# Patient Record
Sex: Male | Born: 1975 | Race: Black or African American | Hispanic: No | Marital: Single | Smoking: Never smoker
Health system: Southern US, Community
[De-identification: ages and names within clinical notes are randomized; demographics above are authoritative.]

## PROBLEM LIST (undated history)

## (undated) DIAGNOSIS — R569 Unspecified convulsions: Secondary | ICD-10-CM

## (undated) DIAGNOSIS — D496 Neoplasm of unspecified behavior of brain: Secondary | ICD-10-CM

## (undated) DIAGNOSIS — Z87442 Personal history of urinary calculi: Secondary | ICD-10-CM

## (undated) DIAGNOSIS — J45909 Unspecified asthma, uncomplicated: Secondary | ICD-10-CM

## (undated) HISTORY — PX: WISDOM TOOTH EXTRACTION: SHX21

## (undated) HISTORY — PX: ABDOMINAL SURGERY: SHX537

---

## 1997-11-12 ENCOUNTER — Emergency Department (HOSPITAL_COMMUNITY): Admission: EM | Admit: 1997-11-12 | Discharge: 1997-11-12 | Payer: Self-pay | Admitting: Emergency Medicine

## 1998-09-12 ENCOUNTER — Emergency Department (HOSPITAL_COMMUNITY): Admission: EM | Admit: 1998-09-12 | Discharge: 1998-09-12 | Payer: Self-pay | Admitting: Emergency Medicine

## 2005-10-30 ENCOUNTER — Emergency Department (HOSPITAL_COMMUNITY): Admission: EM | Admit: 2005-10-30 | Discharge: 2005-10-30 | Payer: Self-pay | Admitting: Emergency Medicine

## 2015-12-10 ENCOUNTER — Inpatient Hospital Stay (HOSPITAL_COMMUNITY): Payer: PRIVATE HEALTH INSURANCE

## 2015-12-10 ENCOUNTER — Encounter (HOSPITAL_COMMUNITY): Payer: Self-pay | Admitting: Emergency Medicine

## 2015-12-10 ENCOUNTER — Inpatient Hospital Stay (HOSPITAL_COMMUNITY)
Admission: EM | Admit: 2015-12-10 | Discharge: 2015-12-12 | DRG: 100 | Disposition: A | Discharge status: Home / Self Care | Payer: PRIVATE HEALTH INSURANCE | Attending: Internal Medicine | Admitting: Internal Medicine

## 2015-12-10 ENCOUNTER — Emergency Department (HOSPITAL_COMMUNITY): Payer: PRIVATE HEALTH INSURANCE

## 2015-12-10 DIAGNOSIS — F1010 Alcohol abuse, uncomplicated: Secondary | ICD-10-CM

## 2015-12-10 DIAGNOSIS — R569 Unspecified convulsions: Secondary | ICD-10-CM

## 2015-12-10 DIAGNOSIS — C7931 Secondary malignant neoplasm of brain: Secondary | ICD-10-CM | POA: Diagnosis present

## 2015-12-10 DIAGNOSIS — G936 Cerebral edema: Secondary | ICD-10-CM | POA: Diagnosis present

## 2015-12-10 DIAGNOSIS — G939 Disorder of brain, unspecified: Secondary | ICD-10-CM | POA: Diagnosis not present

## 2015-12-10 DIAGNOSIS — W19XXXA Unspecified fall, initial encounter: Secondary | ICD-10-CM | POA: Diagnosis not present

## 2015-12-10 DIAGNOSIS — Z8249 Family history of ischemic heart disease and other diseases of the circulatory system: Secondary | ICD-10-CM | POA: Diagnosis not present

## 2015-12-10 DIAGNOSIS — F17210 Nicotine dependence, cigarettes, uncomplicated: Secondary | ICD-10-CM | POA: Diagnosis present

## 2015-12-10 DIAGNOSIS — G40409 Other generalized epilepsy and epileptic syndromes, not intractable, without status epilepticus: Principal | ICD-10-CM | POA: Diagnosis present

## 2015-12-10 DIAGNOSIS — G9389 Other specified disorders of brain: Secondary | ICD-10-CM | POA: Diagnosis present

## 2015-12-10 HISTORY — DX: Unspecified convulsions: R56.9

## 2015-12-10 LAB — DIFFERENTIAL
BASOS ABS: 0.1 10*3/uL (ref 0.0–0.1)
BASOS PCT: 1 %
EOS ABS: 0.3 10*3/uL (ref 0.0–0.7)
Eosinophils Relative: 3 %
Lymphocytes Relative: 48 %
Lymphs Abs: 4.5 10*3/uL — ABNORMAL HIGH (ref 0.7–4.0)
Monocytes Absolute: 0.7 10*3/uL (ref 0.1–1.0)
Monocytes Relative: 7 %
NEUTROS ABS: 3.8 10*3/uL (ref 1.7–7.7)
NEUTROS PCT: 41 %

## 2015-12-10 LAB — CBC
HCT: 42.3 % (ref 39.0–52.0)
HEMOGLOBIN: 14 g/dL (ref 13.0–17.0)
MCH: 28.2 pg (ref 26.0–34.0)
MCHC: 33.1 g/dL (ref 30.0–36.0)
MCV: 85.1 fL (ref 78.0–100.0)
Platelets: 183 10*3/uL (ref 150–400)
RBC: 4.97 MIL/uL (ref 4.22–5.81)
RDW: 13.5 % (ref 11.5–15.5)
WBC: 9.2 10*3/uL (ref 4.0–10.5)

## 2015-12-10 LAB — PROTIME-INR
INR: >10
INR: 1.05
PROTHROMBIN TIME: 13.8 s (ref 11.4–15.2)
Prothrombin Time: >90 seconds — ABNORMAL HIGH (ref 11.4–15.2)

## 2015-12-10 LAB — RAPID URINE DRUG SCREEN, HOSP PERFORMED
Amphetamines: NOT DETECTED
BARBITURATES: NOT DETECTED
Benzodiazepines: NOT DETECTED
COCAINE: NOT DETECTED
Opiates: NOT DETECTED
TETRAHYDROCANNABINOL: NOT DETECTED

## 2015-12-10 LAB — I-STAT CHEM 8, ED
BUN: 16 mg/dL (ref 6–20)
CALCIUM ION: 1.18 mmol/L (ref 1.13–1.30)
Chloride: 103 mmol/L (ref 101–111)
Creatinine, Ser: 1 mg/dL (ref 0.61–1.24)
GLUCOSE: 111 mg/dL — AB (ref 65–99)
HCT: 42 % (ref 39.0–52.0)
Hemoglobin: 14.3 g/dL (ref 13.0–17.0)
Potassium: 3.4 mmol/L — ABNORMAL LOW (ref 3.5–5.1)
SODIUM: 143 mmol/L (ref 135–145)
TCO2: 28 mmol/L (ref 0–100)

## 2015-12-10 LAB — URINALYSIS, ROUTINE W REFLEX MICROSCOPIC
BILIRUBIN URINE: NEGATIVE
GLUCOSE, UA: NEGATIVE mg/dL
HGB URINE DIPSTICK: NEGATIVE
KETONES UR: NEGATIVE mg/dL
Leukocytes, UA: NEGATIVE
NITRITE: NEGATIVE
PH: 6 (ref 5.0–8.0)
Protein, ur: NEGATIVE mg/dL
SPECIFIC GRAVITY, URINE: 1.025 (ref 1.005–1.030)

## 2015-12-10 LAB — BASIC METABOLIC PANEL
ANION GAP: 10 (ref 5–15)
BUN: 17 mg/dL (ref 6–20)
CALCIUM: 9 mg/dL (ref 8.9–10.3)
CHLORIDE: 105 mmol/L (ref 101–111)
CO2: 23 mmol/L (ref 22–32)
CREATININE: 1.02 mg/dL (ref 0.61–1.24)
GFR calc Af Amer: >60 mL/min (ref >60)
GFR calc non Af Amer: >60 mL/min (ref >60)
GLUCOSE: 119 mg/dL — AB (ref 65–99)
Potassium: 4.6 mmol/L (ref 3.5–5.1)
Sodium: 138 mmol/L (ref 135–145)

## 2015-12-10 LAB — CBG MONITORING, ED
GLUCOSE-CAPILLARY: 113 mg/dL — AB (ref 65–99)
Glucose-Capillary: 119 mg/dL — ABNORMAL HIGH (ref 65–99)

## 2015-12-10 LAB — APTT: aPTT: >200 seconds (ref 24–36)

## 2015-12-10 MED ORDER — ADULT MULTIVITAMIN W/MINERALS CH
1.0000 | ORAL_TABLET | Freq: Every day | ORAL | Status: DC
  Administered 2015-12-10 – 2015-12-12 (×3): 1 via ORAL
  Filled 2015-12-10 (×3): qty 1

## 2015-12-10 MED ORDER — SODIUM CHLORIDE 0.9 % IV SOLN
500.0000 mg | Freq: Two times a day (BID) | INTRAVENOUS | Status: DC
  Administered 2015-12-10 – 2015-12-12 (×5): 500 mg via INTRAVENOUS
  Filled 2015-12-10 (×6): qty 5

## 2015-12-10 MED ORDER — LORAZEPAM 2 MG/ML IJ SOLN
1.0000 mg | Freq: Once | INTRAMUSCULAR | Status: AC
  Administered 2015-12-10: 1 mg via INTRAVENOUS
  Filled 2015-12-10: qty 1

## 2015-12-10 MED ORDER — LEVETIRACETAM 500 MG/5ML IV SOLN
1000.0000 mg | Freq: Once | INTRAVENOUS | Status: AC
  Administered 2015-12-10: 1000 mg via INTRAVENOUS
  Filled 2015-12-10: qty 10

## 2015-12-10 MED ORDER — THIAMINE HCL 100 MG/ML IJ SOLN
100.0000 mg | Freq: Every day | INTRAMUSCULAR | Status: DC
Start: 2015-12-10 — End: 2015-12-11
  Filled 2015-12-10: qty 2

## 2015-12-10 MED ORDER — ACETAMINOPHEN 325 MG PO TABS: 650.0000 mg | ORAL_TABLET | Freq: Four times a day (QID) | ORAL | Status: DC | PRN

## 2015-12-10 MED ORDER — SODIUM CHLORIDE 0.9 % IV SOLN
INTRAVENOUS | Status: DC
  Administered 2015-12-10: 06:00:00 via INTRAVENOUS

## 2015-12-10 MED ORDER — HYDROMORPHONE HCL 1 MG/ML IJ SOLN
0.5000 mg | Freq: Once | INTRAMUSCULAR | Status: AC
  Administered 2015-12-10: 0.5 mg via INTRAVENOUS
  Filled 2015-12-10: qty 1

## 2015-12-10 MED ORDER — DEXAMETHASONE SODIUM PHOSPHATE 4 MG/ML IJ SOLN
4.0000 mg | Freq: Four times a day (QID) | INTRAMUSCULAR | Status: DC
  Administered 2015-12-10 – 2015-12-12 (×8): 4 mg via INTRAVENOUS
  Filled 2015-12-10 (×8): qty 1

## 2015-12-10 MED ORDER — ONDANSETRON HCL 4 MG PO TABS: 4.0000 mg | ORAL_TABLET | Freq: Four times a day (QID) | ORAL | Status: DC | PRN

## 2015-12-10 MED ORDER — LORAZEPAM 2 MG/ML IJ SOLN: 1.0000 mg | Freq: Four times a day (QID) | INTRAMUSCULAR | Status: DC | PRN

## 2015-12-10 MED ORDER — ACETAMINOPHEN 650 MG RE SUPP: 650.0000 mg | Freq: Four times a day (QID) | RECTAL | Status: DC | PRN

## 2015-12-10 MED ORDER — FOLIC ACID 1 MG PO TABS
1.0000 mg | ORAL_TABLET | Freq: Every day | ORAL | Status: DC
  Administered 2015-12-10 – 2015-12-12 (×3): 1 mg via ORAL
  Filled 2015-12-10 (×3): qty 1

## 2015-12-10 MED ORDER — LORAZEPAM 2 MG/ML IJ SOLN
0.0000 mg | Freq: Two times a day (BID) | INTRAMUSCULAR | Status: DC
Start: 2015-12-12 — End: 2015-12-12

## 2015-12-10 MED ORDER — SODIUM CHLORIDE 0.9% FLUSH
3.0000 mL | Freq: Two times a day (BID) | INTRAVENOUS | Status: DC
  Administered 2015-12-10 – 2015-12-11 (×3): 3 mL via INTRAVENOUS

## 2015-12-10 MED ORDER — VITAMIN B-1 100 MG PO TABS
100.0000 mg | ORAL_TABLET | Freq: Every day | ORAL | Status: DC
  Administered 2015-12-10 – 2015-12-12 (×3): 100 mg via ORAL
  Filled 2015-12-10 (×3): qty 1

## 2015-12-10 MED ORDER — LORAZEPAM 1 MG PO TABS
1.0000 mg | ORAL_TABLET | Freq: Four times a day (QID) | ORAL | Status: DC | PRN
Start: 2015-12-10 — End: 2015-12-12

## 2015-12-10 MED ORDER — DEXAMETHASONE SODIUM PHOSPHATE 10 MG/ML IJ SOLN
10.0000 mg | Freq: Once | INTRAMUSCULAR | Status: AC
  Administered 2015-12-10: 10 mg via INTRAVENOUS
  Filled 2015-12-10: qty 1

## 2015-12-10 MED ORDER — LORAZEPAM 2 MG/ML IJ SOLN: 1.0000 mg | INTRAMUSCULAR | Status: DC | PRN

## 2015-12-10 MED ORDER — LORAZEPAM 2 MG/ML IJ SOLN
0.0000 mg | Freq: Four times a day (QID) | INTRAMUSCULAR | Status: AC
Start: 2015-12-10 — End: 2015-12-12

## 2015-12-10 MED ORDER — GADOBENATE DIMEGLUMINE 529 MG/ML IV SOLN
19.0000 mL | Freq: Once | INTRAVENOUS | Status: AC | PRN
  Administered 2015-12-10: 19 mL via INTRAVENOUS

## 2015-12-10 MED ORDER — ONDANSETRON HCL 4 MG/2ML IJ SOLN: 4.0000 mg | Freq: Four times a day (QID) | INTRAMUSCULAR | Status: DC | PRN

## 2015-12-10 MED ORDER — SODIUM CHLORIDE 0.9 % IV SOLN: 1000.0000 mg | Freq: Once | INTRAVENOUS | Status: DC

## 2015-12-10 NOTE — ED Notes (Signed)
HR 116, CBG 108 in route.

## 2015-12-10 NOTE — ED Notes (Signed)
Bed: [id number] Expected date:  Expected time:  Means of arrival:  Comments: 89 m Seizure

## 2015-12-10 NOTE — ED Notes (Signed)
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  He began to fall to the ground and the co-worker helped ease him to the ground and he began convulsing.  Pt reports history of seizures as a child.

## 2015-12-10 NOTE — ED Provider Notes (Signed)
[facility] DEPT Provider Note   CSN: [id number] Arrival date & time: 12/10/15  0204  First Provider Contact:  First MD Initiated Contact with Patient 12/10/15 0403        History   Chief Complaint Chief Complaint  Patient presents with  . Seizures    HPI Timothy Murillo is a 40 y.o. male.  This a 40 year old male.  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  His coworker helped him to the ground where he proceeded to have a witnessed seizure.  Unfortunately, his coworker did not accompany him to describe the seizure.  Family states that he was staring out into space and not tracking for a long period of time after his seizure.  He does have a small contusion to his left eyebrow and tooth pain as a result of hitting his face on the ground.  His parents accompany him, he reported that he had a history of febrile seizures as an infant but by the time he was 44 had outgrown them.  Patient denies any head trauma, recent headaches or visual disturbances.  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 He reports no recent illnesses      Past Medical History:  Diagnosis Date  . Hernia of abdominal wall   . Seizures [facility])     Patient Active Problem List   Diagnosis Date Noted  . Mass, brain 12/10/2015    Past Surgical History:  Procedure Laterality Date  . ABDOMINAL SURGERY     hernia  . WISDOM TOOTH EXTRACTION         Home Medications    Prior to Admission medications   Not on File    Family History No family history on file.  Social History Social History  Substance Use Topics  . Smoking status: Current Some Day Smoker    Types: Cigarettes  . Smokeless tobacco: Never Used  . Alcohol use Yes     Comment: 2-3 times a week      Allergies   Review of patient's allergies indicates no known allergies.   Review of Systems Review of Systems    Constitutional: Negative for chills and fever.  HENT: Positive for dental problem. Negative for congestion.   Eyes: Negative for visual disturbance.  Gastrointestinal: Negative for abdominal pain.  Musculoskeletal: Negative for myalgias.  Neurological: Positive for seizures. Negative for dizziness, tremors, speech difficulty, weakness and headaches.  All other systems reviewed and are negative.    Physical Exam Updated Vital Signs BP 140/93   Pulse 74   Temp 98.1 F (36.7 C) (Oral)   Resp 22   Ht 6\' 2"  (1.88 m)   Wt 86.2 kg   SpO2 99%   BMI 24.39 kg/m   Physical Exam  Constitutional: He appears well-developed and well-nourished. No distress.  HENT:  Head: Normocephalic.    Right Ear: External ear normal.  Left Ear: External ear normal.  Mouth/Throat:    Eyes: Pupils are equal, round, and reactive to light.  Neck: Normal range of motion.  Cardiovascular: Normal rate.   Pulmonary/Chest: Effort normal.  Abdominal: Soft.  Musculoskeletal: Normal range of motion.  Neurological: He is alert.  Skin: Skin is warm.  Psychiatric: He has a normal mood and affect.  Nursing note and vitals reviewed.    ED Treatments / Results  Labs (all labs ordered are listed,  but only abnormal results are displayed) Labs Reviewed  BASIC METABOLIC PANEL - Abnormal; Notable for the following:       Result Value   Glucose, Bld 119 (*)    All other components within normal limits  DIFFERENTIAL - Abnormal; Notable for the following:    Lymphs Abs 4.5 (*)    All other components within normal limits  CBG MONITORING, ED - Abnormal; Notable for the following:    Glucose-Capillary 119 (*)    All other components within normal limits  CBC  URINALYSIS, ROUTINE W REFLEX MICROSCOPIC (NOT AT [facility])  URINE RAPID DRUG SCREEN, HOSP PERFORMED  CBC WITH DIFFERENTIAL/PLATELET  I-STAT CHEM 8, ED  CBG MONITORING, ED    EKG  EKG Interpretation  Date/Time:  Tuesday December 10 2015 04:14:00  EDT Ventricular Rate:  76 PR Interval:    QRS Duration: 92 QT Interval:  374 QTC Calculation: 421 R Axis:   46 Text Interpretation:  duplicate discard Confirmed by Roxanne Mins  MD, DAVID ([id number]) on 12/10/2015 4:28:38 AM       Radiology Ct Head Wo Contrast  Result Date: 12/10/2015 CLINICAL DATA:  Acute onset of convulsions. Hit right side of head. Initial encounter. EXAM: CT HEAD WITHOUT CONTRAST TECHNIQUE: Contiguous axial images were obtained from the base of the skull through the vertex without intravenous contrast. COMPARISON:  None. FINDINGS: A prominent vague area of decreased attenuation is noted at the white matter of the left posterior parietal lobe, concerning for underlying mass. MRI of the brain with contrast is recommended for further evaluation. There is no evidence of acute infarct or intra or extra-axial hemorrhage at this time. The posterior fossa, including the cerebellum, brainstem and fourth ventricle, is within normal limits. The third and lateral ventricles, and basal ganglia are unremarkable in appearance. No midline shift is seen. There is no evidence of fracture; visualized osseous structures are unremarkable in appearance. The visualized portions of the orbits are within normal limits. The paranasal sinuses and mastoid air cells are well-aerated. No significant soft tissue abnormalities are seen. IMPRESSION: Prominent vague area of decreased attenuation at the white matter of the left posterior parietal lobe, concerning for underlying mass. MRI of the brain with contrast is recommended for further evaluation. These results were called by telephone at the time of interpretation on 12/10/2015 at [id number] am to Dr. Roxanne Mins, who verbally acknowledged these results. Electronically Signed   By: Garald Balding M.D.   On: 12/10/2015 04:42    Procedures Procedures (including critical care time)  Medications Ordered in ED Medications  LORazepam (ATIVAN) injection 1 mg (not administered)   levETIRAcetam (KEPPRA) 1,000 mg in sodium chloride 0.9 % 100 mL IVPB (not administered)  dexamethasone (DECADRON) injection 10 mg (not administered)  levETIRAcetam (KEPPRA) 1,000 mg in sodium chloride 0.9 % 100 mL IVPB (not administered)  LORazepam (ATIVAN) injection 1 mg (1 mg Intravenous Given 12/10/15 0500)  HYDROmorphone (DILAUDID) injection 0.5 mg (0.5 mg Intravenous Given 12/10/15 0521)     Initial Impression / Assessment and Plan / ED Course  I have reviewed the triage vital signs and the nursing notes.  Pertinent labs & imaging results that were available during my care of the patient were reviewed by me and considered in my medical decision making (see chart for details).  Clinical Course   CT scan is concerning for a brain mass.  MRI is recommended.  Patient has been given 1 mg of Ativan to prevent further seizures.  MRI has been ordered.  I will make arrangements for admission to the hospital for further evaluation Reason has been given a gram of Keppra.  10 mg of Decadron. I discussed the CT findings with patient and family   Final Clinical Impressions(s) / ED Diagnoses   Final diagnoses:  Seizure [facility])  Mass of brain    New Prescriptions New Prescriptions   No medications on file     Junius Creamer, NP 12/10/15 0504    Junius Creamer, NP 12/10/15 [place], [place] [id number]    David Glick, MD [id number]

## 2015-12-10 NOTE — Consult Note (Signed)
Reason for Consult: Brain mass Referring Physician: [facility]  Timothy Murillo is an 40 y.o. male.  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 He currently denies headache denies any nausea vomiting denies any difficulty with his vision has had some dysphagia over the last couple weeks of unknown etiology denies any singing arms or his legs.  Past Medical History:  Diagnosis Date  . Hernia of abdominal wall   . Seizures ([place])     Past Surgical History:  Procedure Laterality Date  . ABDOMINAL SURGERY     hernia  . WISDOM TOOTH EXTRACTION      Family History  Problem Relation Age of Onset  . Hypertension Mother   . Hypertension Father     Social History:  reports that he has been smoking Cigarettes.  He has never used smokeless tobacco. He reports that he drinks alcohol. He reports that he does not use drugs.  Allergies: No Known Allergies  Medications: I have reviewed the patient's current medications.  Results for orders placed or performed during the hospital encounter of 12/10/15 (from the past 48 hour(s))  Basic metabolic panel - if new onset seizures     Status: Abnormal   Collection Time: 12/10/15  2:20 AM  Result Value Ref Range   Sodium 138 135 - 145 mmol/L   Potassium 4.6 3.5 - 5.1 mmol/L    Comment: SLIGHT HEMOLYSIS   Chloride 105 101 - 111 mmol/L   CO2 23 22 - 32 mmol/L   Glucose, Bld 119 (H) 65 - 99 mg/dL   BUN 17 6 - 20 mg/dL   Creatinine, Ser 1.02 0.61 - 1.24 mg/dL   Calcium 9.0 8.9 - 10.3 mg/dL   GFR calc non Af Amer >60 >60 mL/min   GFR calc Af Amer >60 >60 mL/min    Comment: (NOTE) The eGFR has been calculated using the CKD EPI equation. This calculation has not  been validated in all clinical situations. eGFR's persistently <60 mL/min signify possible Chronic Kidney Disease.    Anion gap 10 5 - 15  CBC - if new onset seizures     Status: None   Collection Time: 12/10/15  2:20 AM  Result Value Ref Range   WBC 9.2 4.0 - 10.5 K/uL   RBC 4.97 4.22 - 5.81 MIL/uL   Hemoglobin 14.0 13.0 - 17.0 g/dL   HCT 42.3 39.0 - 52.0 %   MCV 85.1 78.0 - 100.0 fL   MCH 28.2 26.0 - 34.0 pg   MCHC 33.1 30.0 - 36.0 g/dL   RDW 13.5 11.5 - 15.5 %   Platelets 183 150 - 400 K/uL  Differential     Status: Abnormal   Collection Time: 12/10/15  2:20 AM  Result Value Ref Range   Neutrophils Relative % 41 %   Neutro Abs 3.8 1.7 - 7.7 K/uL   Lymphocytes Relative 48 %   Lymphs Abs 4.5 (H) 0.7 - 4.0 K/uL   Monocytes Relative 7 %   Monocytes Absolute 0.7 0.1 - 1.0 K/uL   Eosinophils Relative 3 %   Eosinophils Absolute 0.3 0.0 - 0.7 K/uL   Basophils Relative 1 %   Basophils Absolute  0.1 0.0 - 0.1 K/uL  CBG monitoring, ED     Status: Abnormal   Collection Time: 12/10/15  2:25 AM  Result Value Ref Range   Glucose-Capillary 119 (H) 65 - 99 mg/dL  I-stat chem 8, ed     Status: Abnormal   Collection Time: 12/10/15  4:08 AM  Result Value Ref Range   Sodium 143 135 - 145 mmol/L   Potassium 3.4 (L) 3.5 - 5.1 mmol/L   Chloride 103 101 - 111 mmol/L   BUN 16 6 - 20 mg/dL   Creatinine, Ser 1.00 0.61 - 1.24 mg/dL   Glucose, Bld 111 (H) 65 - 99 mg/dL   Calcium, Ion 1.18 1.13 - 1.30 mmol/L   TCO2 28 0 - 100 mmol/L   Hemoglobin 14.3 13.0 - 17.0 g/dL   HCT 42.0 39.0 - 52.0 %  Urinalysis, Routine w reflex microscopic (not at [facility])     Status: None   Collection Time: 12/10/15  4:16 AM  Result Value Ref Range   Color, Urine YELLOW YELLOW   APPearance CLEAR CLEAR   Specific Gravity, Urine 1.025 1.005 - 1.030   pH 6.0 5.0 - 8.0   Glucose, UA NEGATIVE NEGATIVE mg/dL   Hgb urine dipstick NEGATIVE NEGATIVE   Bilirubin Urine NEGATIVE NEGATIVE   Ketones, ur NEGATIVE NEGATIVE mg/dL    Protein, ur NEGATIVE NEGATIVE mg/dL   Nitrite NEGATIVE NEGATIVE   Leukocytes, UA NEGATIVE NEGATIVE    Comment: MICROSCOPIC NOT DONE ON URINES WITH NEGATIVE PROTEIN, BLOOD, LEUKOCYTES, NITRITE, OR GLUCOSE <1000 mg/dL.  Urine rapid drug screen (hosp performed)     Status: None   Collection Time: 12/10/15  4:16 AM  Result Value Ref Range   Opiates NONE DETECTED NONE DETECTED   Cocaine NONE DETECTED NONE DETECTED   Benzodiazepines NONE DETECTED NONE DETECTED   Amphetamines NONE DETECTED NONE DETECTED   Tetrahydrocannabinol NONE DETECTED NONE DETECTED   Barbiturates NONE DETECTED NONE DETECTED    Comment:        DRUG SCREEN FOR MEDICAL PURPOSES ONLY.  IF CONFIRMATION IS NEEDED FOR ANY PURPOSE, NOTIFY LAB WITHIN 5 DAYS.        LOWEST DETECTABLE LIMITS FOR URINE DRUG SCREEN Drug Class       Cutoff (ng/mL) Amphetamine      1000 Barbiturate      200 Benzodiazepine   462 Tricyclics       703 Opiates          300 Cocaine          300 THC              50   Protime-INR     Status: Abnormal   Collection Time: 12/10/15  5:43 AM  Result Value Ref Range   Prothrombin Time >90.0 (H) 11.4 - 15.2 seconds   INR >10.00 (HH)     Comment: SPECIMEN CHECKED FOR CLOTS REPEATED TO VERIFY CRITICAL RESULT CALLED TO, READ BACK BY AND VERIFIED WITH: REPEATED TO VERIFY CRITICAL RESULT CALLED TO, READ BACK BY AND VERIFIED WITH: J. NASH RN AT 5009 ON 08.08.17 BY SHUEA   APTT     Status: Abnormal   Collection Time: 12/10/15  5:43 AM  Result Value Ref Range   aPTT >200 (HH) 24 - 36 seconds    Comment:        IF BASELINE aPTT IS ELEVATED, SUGGEST PATIENT RISK ASSESSMENT BE USED TO DETERMINE APPROPRIATE ANTICOAGULANT THERAPY. SPECIMEN CHECKED FOR CLOTS REPEATED TO VERIFY CRITICAL RESULT CALLED  TO, READ BACK BY AND VERIFIED WITH: J. NASH RN AT [phone] ON 08.08.17 BY SHUEA   POC CBG, ED     Status: Abnormal   Collection Time: 12/10/15  7:47 AM  Result Value Ref Range   Glucose-Capillary 113 (H) 65  - 99 mg/dL    Ct Head Wo Contrast  Result Date: 12/10/2015 CLINICAL DATA:  Acute onset of convulsions. Hit right side of head. Initial encounter. EXAM: CT HEAD WITHOUT CONTRAST TECHNIQUE: Contiguous axial images were obtained from the base of the skull through the vertex without intravenous contrast. COMPARISON:  None. FINDINGS: A prominent vague area of decreased attenuation is noted at the white matter of the left posterior parietal lobe, concerning for underlying mass. MRI of the brain with contrast is recommended for further evaluation. There is no evidence of acute infarct or intra or extra-axial hemorrhage at this time. The posterior fossa, including the cerebellum, brainstem and fourth ventricle, is within normal limits. The third and lateral ventricles, and basal ganglia are unremarkable in appearance. No midline shift is seen. There is no evidence of fracture; visualized osseous structures are unremarkable in appearance. The visualized portions of the orbits are within normal limits. The paranasal sinuses and mastoid air cells are well-aerated. No significant soft tissue abnormalities are seen. IMPRESSION: Prominent vague area of decreased attenuation at the white matter of the left posterior parietal lobe, concerning for underlying mass. MRI of the brain with contrast is recommended for further evaluation. These results were called by telephone at the time of interpretation on 12/10/2015 at 3:72 am to Dr. Roxanne Mins, who verbally acknowledged these results. Electronically Signed   By: Garald Balding M.D.   On: 12/10/2015 04:42    Review of Systems  Constitutional: Negative.   Neurological: Positive for dizziness and seizures.   Blood pressure (!) 120/58, pulse 64, temperature 98.1 F (36.7 C), temperature source Oral, resp. rate 16, height '6\' 2"'$  (1.88 m), weight 86.2 kg (190 lb), SpO2 100 %. Physical Exam  Constitutional: He is oriented to person, place, and time. He appears well-developed and  well-nourished.  HENT:  Head: Normocephalic.  Eyes: Pupils are equal, round, and reactive to light.  Neck: Normal range of motion.  Respiratory: Effort normal and breath sounds normal.  GI: Soft. Bowel sounds are normal.  Musculoskeletal: Normal range of motion.  Neurological: He is alert and oriented to person, place, and time. He has normal strength. GCS eye subscore is 4. GCS verbal subscore is 5. GCS motor subscore is 6.  Patient is awake and alert pupils are equal cranial nerves are intact strength is 5 out of 5 in his upper and lower extremities    Assessment/Plan: 40 year old gentleman with hyperdensity left parietal occipital lobe. Consistent with either brain mass primary or metastatic possibly infection recommend MRI scan of his brain and then depending the results of his MRI continued full metastatic workup if it has an appearance consistent with.  Roosevelt Eimers P 12/10/2015, 11:48 AM

## 2015-12-10 NOTE — ED Notes (Signed)
Hospitalist at bedside 

## 2015-12-10 NOTE — ED Notes (Signed)
Pt continuously asking where his wallet is.  Per EMS, no wallet was on patient or with patient at time of arrival.  EMS double checked truck with no wallet found.  Family and patient aware.

## 2015-12-10 NOTE — ED Notes (Signed)
[facility] called for transport. 

## 2015-12-10 NOTE — Care Management Note (Signed)
Case Management Note  Patient Details  Name: Timothy Murillo MRN: [id number] Date of Birth: 1976/04/20  Subjective/Objective:  Pt in with seizure. Awaiting MRI results. Pt is from home.                 Action/Plan: Awaiting further testing. CM following for discharge needs.   Expected Discharge Date:                  Expected Discharge Plan:  Home/Self Care  In-House Referral:     Discharge planning Services     Post Acute Care Choice:    Choice offered to:     DME Arranged:    DME Agency:     HH Arranged:    HH Agency:     Status of Service:  In process, will continue to follow  If discussed at Long Length of Stay Meetings, dates discussed:    Additional Comments:  Pollie Friar, RN 12/10/2015, 4:14 PM

## 2015-12-10 NOTE — Progress Notes (Addendum)
PROGRESS NOTE    Timothy Murillo  [id number] DOB: 1976-03-17 DOA: 12/10/2015 PCP: No PCP Per Patient    Brief Narrative: Left posterior parietal lobe mass. 40 yo male with history of seizures his childhood, presented to the hospital with generalized tonic clonic seizure. Transferred to from [facility] for further neurosurgery evaluation.   Assessment & Plan:   Principal Problem:   Seizure [facility]) Active Problems:   Mass of brain   1. Left posterior parietal lobe mass. Will follow on MRI report, follow with neurosurgery recommendations. Clinically patient awake and alert, non focal, no nausea or vomiting. Continue high dose steroids with decadron IV.   2. Seizures. Continue neuro checks, keppra and as needed lorazepam, continue to follow with neurology recommendations.   3. Etho abuse. No clinical signs of withdrawal, will continue as needed benzodiazepines, folic acid, multivitamin and thiamine.    Noted elevated PT/ INR will repeat test.   .DVT ppx: SCD Code Status: Full code Family Communication:  I spoke with patient's family at the bedside and all questions were addressed. Disposition Plan:  Anticipate discharge back to previous home environment  Consults called:  Neurosurgeon, Dr. Saintclair Halsted was consulted.  Procedures:   Antimicrobials:    Subjective: Patient with no headache, no nausea or vomiting. No chest pain or dyspnea. No recurrent seiziures.   Objective: Vitals:   12/10/15 0600 12/10/15 0630 12/10/15 0700 12/10/15 0833  BP: 133/87 125/86 128/87 (!) 120/58  Pulse: 69 70 66 64  Resp: 16 16 15 16   Temp:    98.1 F (36.7 C)  TempSrc:    Oral  SpO2: 96% 95% 96% 100%  Weight:      Height:       No intake or output data in the 24 hours ending 12/10/15 1243 Filed Weights   12/10/15 0209  Weight: 86.2 kg (190 lb)    Examination:  General exam: not in pain or dyspnea E ENT. No conjunctival pallor or icterus. Oral mucosa moist. Respiratory system: Clear to  auscultation. Respiratory effort normal. No wheezing, rales or rhonchi. Cardiovascular system: S1 & S2 heard, RRR. No JVD, murmurs, rubs, gallops or clicks. No pedal edema. Gastrointestinal system: Abdomen is nondistended, soft and nontender. No organomegaly or masses felt. Normal bowel sounds heard. Central nervous system: Alert and oriented. No focal neurological deficits. Extremities: Symmetric 5 x 5 power. Skin: No rashes, lesions or ulcers    Data Reviewed: I have personally reviewed following labs and imaging studies  CBC:  Recent Labs Lab 12/10/15 0220 12/10/15 0408  WBC 9.2  --   NEUTROABS 3.8  --   HGB 14.0 14.3  HCT 42.3 42.0  MCV 85.1  --   PLT 183  --    Basic Metabolic Panel:  Recent Labs Lab 12/10/15 0220 12/10/15 0408  NA 138 143  K 4.6 3.4*  CL 105 103  CO2 23  --   GLUCOSE 119* 111*  BUN 17 16  CREATININE 1.02 1.00  CALCIUM 9.0  --    GFR: Estimated Creatinine Clearance: 113 mL/min (by C-G formula based on SCr of 1 mg/dL). Liver Function Tests: No results for input(s): AST, ALT, ALKPHOS, BILITOT, PROT, ALBUMIN in the last 168 hours. No results for input(s): LIPASE, AMYLASE in the last 168 hours. No results for input(s): AMMONIA in the last 168 hours. Coagulation Profile:  Recent Labs Lab 12/10/15 0543  INR >10.00*   Cardiac Enzymes: No results for input(s): CKTOTAL, CKMB, CKMBINDEX, TROPONINI in the last 168 hours.  BNP (last 3 results) No results for input(s): PROBNP in the last 8760 hours. HbA1C: No results for input(s): HGBA1C in the last 72 hours. CBG:  Recent Labs Lab 12/10/15 0225 12/10/15 0747  GLUCAP 119* 113*   Lipid Profile: No results for input(s): CHOL, HDL, LDLCALC, TRIG, CHOLHDL, LDLDIRECT in the last 72 hours. Thyroid Function Tests: No results for input(s): TSH, T4TOTAL, FREET4, T3FREE, THYROIDAB in the last 72 hours. Anemia Panel: No results for input(s): VITAMINB12, FOLATE, FERRITIN, TIBC, IRON, RETICCTPCT in the  last 72 hours. Sepsis Labs: No results for input(s): PROCALCITON, LATICACIDVEN in the last 168 hours.  No results found for this or any previous visit (from the past 240 hour(s)).       Radiology Studies: Ct Head Wo Contrast  Result Date: 12/10/2015 CLINICAL DATA:  Acute onset of convulsions. Hit right side of head. Initial encounter. EXAM: CT HEAD WITHOUT CONTRAST TECHNIQUE: Contiguous axial images were obtained from the base of the skull through the vertex without intravenous contrast. COMPARISON:  None. FINDINGS: A prominent vague area of decreased attenuation is noted at the white matter of the left posterior parietal lobe, concerning for underlying mass. MRI of the brain with contrast is recommended for further evaluation. There is no evidence of acute infarct or intra or extra-axial hemorrhage at this time. The posterior fossa, including the cerebellum, brainstem and fourth ventricle, is within normal limits. The third and lateral ventricles, and basal ganglia are unremarkable in appearance. No midline shift is seen. There is no evidence of fracture; visualized osseous structures are unremarkable in appearance. The visualized portions of the orbits are within normal limits. The paranasal sinuses and mastoid air cells are well-aerated. No significant soft tissue abnormalities are seen. IMPRESSION: Prominent vague area of decreased attenuation at the white matter of the left posterior parietal lobe, concerning for underlying mass. MRI of the brain with contrast is recommended for further evaluation. These results were called by telephone at the time of interpretation on 12/10/2015 at [id number] am to Dr. Roxanne Mins, who verbally acknowledged these results. Electronically Signed   By: Garald Balding M.D.   On: 12/10/2015 04:42        Scheduled Meds: . dexamethasone  4 mg Intravenous [id number]  . folic acid  1 mg Oral Daily  . levETIRAcetam  500 mg Intravenous Q12H  . LORazepam  0-4 mg Intravenous Q6H    Followed by  . [START ON 12/12/2015] LORazepam  0-4 mg Intravenous Q12H  . multivitamin with minerals  1 tablet Oral Daily  . sodium chloride flush  3 mL Intravenous Q12H  . thiamine  100 mg Oral Daily   Or  . thiamine  100 mg Intravenous Daily   Continuous Infusions: . sodium chloride 100 mL/hr at 12/10/15 [id number]     LOS: 0 days      Tawni Millers, MD [facility] Pager [phone]  If 7PM-7AM, please contact night-coverage [email or web address] Password [facility] 12/10/2015, 12:43 PM

## 2015-12-10 NOTE — ED Notes (Signed)
Gave report to nurse on 5MW

## 2015-12-10 NOTE — Progress Notes (Deleted)
DR ARMSTRONG NOTIFIED VIA AMION, RN NOTIFIED THAT PT WILL HAVE MRI UNDER GENERAL ANESTHESIA ON 12/11/15 @ 10am, PT TO BE NPO AT MIDNIGHT FOR PROCEDURE

## 2015-12-10 NOTE — H&P (Signed)
History and Physical    Timothy Murillo [id number] DOB: Jan 23, 1976 DOA: 12/10/2015  Referring MD/NP/PA:   PCP: No PCP Per Patient   Patient coming from:  The patient is coming from home.  At baseline, pt is independent for most of ADL.   Chief Complaint: seizure  HPI: Timothy Murillo is a 40 y.o. male with medical history significant of seizure in childhood, who presents with seizure.   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  He fell to the ground, then began convulsing. He had LOC. Not sure how long it lasted. Pt states that he injured his left side of head and has HA in the same location. Pt reports history of seizures as a child. He does not have vision change, hearing loss, unilateral weakness, numbness or tingling is in extremities. No incontinence of bowel movement or urination. Patient denies fever, chills, chest pain, shortness of breath, cough, nausea, vomiting, diarrhea, abdominal pain. No symptoms of UTI. Patient reports that he drinks 3-6 beer 3-6 each time, 3-4 times a week, but never had withdraw problem.   ED Course: pt was found to have WBC 9.2, negative UDS, negative urinalysis, temperature normal, electrolytes and renal function okay. CT-head showed prominent vague area of decreased attenuation at the white matter of the left posterior parietal lobe, concerning for underlying mass. Pt is admitted to the telemetry bed as inpatient for further eventration treatment. Neurosurgeon, Dr. Saintclair Halsted was consulted.  Review of Systems:   General: no fevers, chills, no changes in body weight, has fatigue and HA HEENT: no blurry vision, hearing changes or sore throat Pulm: no dyspnea, coughing, wheezing CV: no chest pain, no palpitations Abd: no nausea, vomiting, abdominal pain, diarrhea, constipation GU: no dysuria, burning on urination, increased urinary frequency, hematuria  Ext: no leg edema Neuro: no unilateral weakness,  numbness, or tingling, no vision change or hearing loss. Had seizure. Skin: no rash MSK: No muscle spasm, no deformity, no limitation of range of movement in spin Heme: No easy bruising.  Travel history: No recent long distant travel.  Allergy: No Known Allergies  Past Medical History:  Diagnosis Date  . Hernia of abdominal wall   . Seizures ([place])     Past Surgical History:  Procedure Laterality Date  . ABDOMINAL SURGERY     hernia  . WISDOM TOOTH EXTRACTION      Social History:  reports that he has been smoking Cigarettes.  He has never used smokeless tobacco. He reports that he drinks alcohol. He reports that he does not use drugs.  Family History:  Family History  Problem Relation Age of Onset  . Hypertension Mother   . Hypertension Father      Prior to Admission medications   Not on File    Physical Exam: Vitals:   12/10/15 0245 12/10/15 0300 12/10/15 0439 12/10/15 0500  BP:   129/77 140/93  Pulse: 87 89 76 74  Resp: 19 18 17 22   Temp:      TempSrc:      SpO2: 97% 94% 97% 99%  Weight:      Height:       General: Not in acute distress HEENT:       Eyes: PERRL, EOMI, no scleral icterus.       ENT: No discharge from the ears and nose, no pharynx injection, no tonsillar enlargement.        Neck: No JVD, no bruit,  no mass felt. Heme: No neck lymph node enlargement. Cardiac: S1/S2, RRR, No murmurs, No gallops or rubs. Pulm: No rales, wheezing, rhonchi or rubs. Abd: Soft, nondistended, nontender, no rebound pain, no organomegaly, BS present. GU: No hematuria Ext: No pitting leg edema bilaterally. 2+DP/PT pulse bilaterally. Musculoskeletal: No joint deformities, No joint redness or warmth, no limitation of ROM in spin. Skin: No rashes.  Neuro: Alert, oriented X3, cranial nerves II-XII grossly intact, moves all extremities normally. Muscle strength 5/5 in all extremities, sensation to light touch intact. Knee reflex 1+ bilaterally. Negative Babinski's sign.  Normal finger to nose test. Psych: Patient is not psychotic, no suicidal or hemocidal ideation.  Labs on Admission: I have personally reviewed following labs and imaging studies  CBC:  Recent Labs Lab 12/10/15 0220 12/10/15 0408  WBC 9.2  --   NEUTROABS 3.8  --   HGB 14.0 14.3  HCT 42.3 42.0  MCV 85.1  --   PLT 183  --    Basic Metabolic Panel:  Recent Labs Lab 12/10/15 0220 12/10/15 0408  NA 138 143  K 4.6 3.4*  CL 105 103  CO2 23  --   GLUCOSE 119* 111*  BUN 17 16  CREATININE 1.02 1.00  CALCIUM 9.0  --    GFR: Estimated Creatinine Clearance: 113 mL/min (by C-G formula based on SCr of 1 mg/dL). Liver Function Tests: No results for input(s): AST, ALT, ALKPHOS, BILITOT, PROT, ALBUMIN in the last 168 hours. No results for input(s): LIPASE, AMYLASE in the last 168 hours. No results for input(s): AMMONIA in the last 168 hours. Coagulation Profile:  Recent Labs Lab 12/10/15 0543  INR >10.00*   Cardiac Enzymes: No results for input(s): CKTOTAL, CKMB, CKMBINDEX, TROPONINI in the last 168 hours. BNP (last 3 results) No results for input(s): PROBNP in the last 8760 hours. HbA1C: No results for input(s): HGBA1C in the last 72 hours. CBG:  Recent Labs Lab 12/10/15 0225  GLUCAP 119*   Lipid Profile: No results for input(s): CHOL, HDL, LDLCALC, TRIG, CHOLHDL, LDLDIRECT in the last 72 hours. Thyroid Function Tests: No results for input(s): TSH, T4TOTAL, FREET4, T3FREE, THYROIDAB in the last 72 hours. Anemia Panel: No results for input(s): VITAMINB12, FOLATE, FERRITIN, TIBC, IRON, RETICCTPCT in the last 72 hours. Urine analysis:    Component Value Date/Time   COLORURINE YELLOW 12/10/2015 0416   APPEARANCEUR CLEAR 12/10/2015 0416   LABSPEC 1.025 12/10/2015 0416   PHURINE 6.0 12/10/2015 0416   GLUCOSEU NEGATIVE 12/10/2015 0416   HGBUR NEGATIVE 12/10/2015 0416   BILIRUBINUR NEGATIVE 12/10/2015 0416   KETONESUR NEGATIVE 12/10/2015 0416   PROTEINUR NEGATIVE  12/10/2015 0416   NITRITE NEGATIVE 12/10/2015 0416   LEUKOCYTESUR NEGATIVE 12/10/2015 0416   Sepsis Labs: @LABRCNTIP (procalcitonin:4,lacticidven:4) )No results found for this or any previous visit (from the past 240 hour(s)).   Radiological Exams on Admission: Ct Head Wo Contrast  Result Date: 12/10/2015 CLINICAL DATA:  Acute onset of convulsions. Hit right side of head. Initial encounter. EXAM: CT HEAD WITHOUT CONTRAST TECHNIQUE: Contiguous axial images were obtained from the base of the skull through the vertex without intravenous contrast. COMPARISON:  None. FINDINGS: A prominent vague area of decreased attenuation is noted at the white matter of the left posterior parietal lobe, concerning for underlying mass. MRI of the brain with contrast is recommended for further evaluation. There is no evidence of acute infarct or intra or extra-axial hemorrhage at this time. The posterior fossa, including the cerebellum, brainstem and fourth ventricle, is within  normal limits. The third and lateral ventricles, and basal ganglia are unremarkable in appearance. No midline shift is seen. There is no evidence of fracture; visualized osseous structures are unremarkable in appearance. The visualized portions of the orbits are within normal limits. The paranasal sinuses and mastoid air cells are well-aerated. No significant soft tissue abnormalities are seen. IMPRESSION: Prominent vague area of decreased attenuation at the white matter of the left posterior parietal lobe, concerning for underlying mass. MRI of the brain with contrast is recommended for further evaluation. These results were called by telephone at the time of interpretation on 12/10/2015 at [id number] am to Dr. Roxanne Mins, who verbally acknowledged these results. Electronically Signed   By: Garald Balding M.D.   On: 12/10/2015 04:42     EKG: Independently reviewed. Sinus rhythm, QTC 421, no ischemic change. 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 No new episode of seizure in ED. Neuro surgeon, Dr. Saintclair Halsted was consulted. Per Dr. Saintclair Halsted, when MRI-brain result is available for mass, will need to start working on metastasis by CT scan (not ordered yet)  -will admit to tele bed as inpt -f/u Neurosurgeon's recommendation -MRI brain w/ and w/o contrast -Seizure precaution -Keppra: 1000 mg IV 1, followed by 500 mg twice a day -Decadron: 10 mg 1 and 4 mg every 6 hours IV -Nothing by mouth -IV fluids: Normocephalic and cc per hour -prn ativan for seizure -check HIV ab  Alcohol use: Patient reports that he drinks 3-6 beer 3-6 each time, 3-4 times a week, but never had withdraw problem.  -start CIWA protocol   DVT ppx: SCD Code Status: Full code Family Communication:  Yes, patient's  fianc, and parents  at bed side Disposition Plan:  Anticipate discharge back to previous home environment Consults called:  Neurosurgeon, Dr. Saintclair Halsted was consulted,will see today Admission status: Inpatient/tele  Date of Service 12/10/2015    Ivor Costa [facility] Pager [phone]  If 7PM-7AM, please contact night-coverage [email or web address] Password [facility] 12/10/2015, 6:41 AM

## 2015-12-10 NOTE — Evaluation (Signed)
Clinical/Bedside Swallow Evaluation Patient Details  Name: Timothy Murillo MRN: [id number] Date of Birth: 1975/05/27  Today's Date: 12/10/2015 Time: SLP Start Time (ACUTE ONLY): [id number] SLP Stop Time (ACUTE ONLY): 1510 SLP Time Calculation (min) (ACUTE ONLY): 14 min  Past Medical History:  Past Medical History:  Diagnosis Date  . Hernia of abdominal wall   . Seizures ([place])    Past Surgical History:  Past Surgical History:  Procedure Laterality Date  . ABDOMINAL SURGERY     hernia  . 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  Patient transferred to [facility] and MRI is pending.     Assessment / Plan / Recommendation Clinical Impression  Patient demonstrates intact oral motor abilities with efficient mastication, oral clearance, and swift swallow along with no overt s/s of aspiration.  Recommend initiation of regular textures and thin liquids with small portions at a slow pace as well as intermittent supervision for use of safety precautions.  Given reports of intermittent difficulty with swallowing recommend brief SLP follow up to ensure tolerance.   Skilled follow up services are not an anticipated need at this time.      Aspiration Risk  Mild aspiration risk    Diet Recommendation Regular;Thin liquid   Liquid Administration via: Cup;Straw Medication Administration: Whole meds with liquid Supervision: Patient able to self feed;Intermittent supervision to cue for compensatory strategies Compensations: Slow rate;Small sips/bites Postural Changes: Seated upright at 90 degrees    Other  Recommendations Oral Care Recommendations: Oral care BID   Follow up Recommendations  None    Frequency and Duration min 2x/week  1 week       Prognosis Prognosis for Safe  Diet Advancement: Good      Swallow Study   General HPI:  Patient is a 40 year old gentleman 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  Patient transferred to [facility] and MRI is pending.   Type of Study: Bedside Swallow Evaluation Previous Swallow Assessment: none on record Diet Prior to this Study: NPO Temperature Spikes Noted: No Respiratory Status: Room air History of Recent Intubation: No Behavior/Cognition: Alert;Cooperative;Pleasant mood Oral Cavity Assessment: Within Functional Limits Oral Care Completed by SLP: Recent completion by staff Oral Cavity - Dentition: Adequate natural dentition Vision: Functional for self-feeding Self-Feeding Abilities: Able to feed self Patient Positioning: Upright in bed Baseline Vocal Quality: Normal Volitional Cough: Strong Volitional Swallow: Able to elicit    Oral/Motor/Sensory Function Overall Oral Motor/Sensory Function: Within functional limits   Ice Chips Ice chips: Within functional limits   Thin Liquid Thin Liquid: Within functional limits Presentation: Cup;Straw    Nectar Thick Nectar Thick Liquid: Not tested   Honey Thick Honey Thick Liquid: Not tested   Puree Puree: Within functional limits Presentation: Self Fed;Spoon   Solid   GO   Solid: Within functional limits Presentation: Self Fed       Gunnar Fusi, M.A., CCC-SLP [phone]   [place] 12/10/2015,3:58 PM

## 2015-12-10 NOTE — ED Notes (Signed)
Report given to carelink. RN Water quality scientist at [facility] notified that transfer will occur within the hour. MRI will need to be completed to [facility] hospital.

## 2015-12-11 ENCOUNTER — Encounter (HOSPITAL_COMMUNITY): Payer: Self-pay | Admitting: Radiology

## 2015-12-11 ENCOUNTER — Inpatient Hospital Stay (HOSPITAL_COMMUNITY): Payer: PRIVATE HEALTH INSURANCE

## 2015-12-11 DIAGNOSIS — R569 Unspecified convulsions: Secondary | ICD-10-CM

## 2015-12-11 DIAGNOSIS — F1010 Alcohol abuse, uncomplicated: Secondary | ICD-10-CM

## 2015-12-11 DIAGNOSIS — G939 Disorder of brain, unspecified: Secondary | ICD-10-CM

## 2015-12-11 LAB — CBC
HCT: 42.6 % (ref 39.0–52.0)
Hemoglobin: 13.9 g/dL (ref 13.0–17.0)
MCH: 28.4 pg (ref 26.0–34.0)
MCHC: 32.6 g/dL (ref 30.0–36.0)
MCV: 86.9 fL (ref 78.0–100.0)
PLATELETS: 378 10*3/uL (ref 150–400)
RBC: 4.9 MIL/uL (ref 4.22–5.81)
RDW: 13.7 % (ref 11.5–15.5)
WBC: 16.3 10*3/uL — ABNORMAL HIGH (ref 4.0–10.5)

## 2015-12-11 LAB — PROTIME-INR
INR: 1.04
PROTHROMBIN TIME: 13.6 s (ref 11.4–15.2)

## 2015-12-11 LAB — COMPREHENSIVE METABOLIC PANEL
ALK PHOS: 44 U/L (ref 38–126)
ALT: 28 U/L (ref 17–63)
ANION GAP: 8 (ref 5–15)
AST: 36 U/L (ref 15–41)
Albumin: 3.5 g/dL (ref 3.5–5.0)
BILIRUBIN TOTAL: 0.9 mg/dL (ref 0.3–1.2)
BUN: 9 mg/dL (ref 6–20)
CALCIUM: 9.2 mg/dL (ref 8.9–10.3)
CO2: 25 mmol/L (ref 22–32)
CREATININE: 0.75 mg/dL (ref 0.61–1.24)
Chloride: 107 mmol/L (ref 101–111)
GFR calc non Af Amer: >60 mL/min (ref >60)
GLUCOSE: 139 mg/dL — AB (ref 65–99)
Potassium: 3.9 mmol/L (ref 3.5–5.1)
SODIUM: 140 mmol/L (ref 135–145)
TOTAL PROTEIN: 7 g/dL (ref 6.5–8.1)

## 2015-12-11 LAB — GLUCOSE, CAPILLARY: Glucose-Capillary: 143 mg/dL — ABNORMAL HIGH (ref 65–99)

## 2015-12-11 LAB — HIV ANTIBODY (ROUTINE TESTING W REFLEX): HIV Screen 4th Generation wRfx: NONREACTIVE

## 2015-12-11 MED ORDER — DIATRIZOATE MEGLUMINE & SODIUM 66-10 % PO SOLN
ORAL | Status: AC
  Administered 2015-12-11: 12:00:00
  Filled 2015-12-11: qty 30

## 2015-12-11 MED ORDER — IOPAMIDOL (ISOVUE-300) INJECTION 61%
INTRAVENOUS | Status: AC
  Administered 2015-12-11: 100 mL
  Filled 2015-12-11: qty 100

## 2015-12-11 NOTE — Progress Notes (Signed)
Speech Language Pathology Treatment: Dysphagia  Patient Details Name: Timothy Murillo MRN: [id number] DOB: [birth year] Today's Date: 12/11/2015 Time: 0211-1735 SLP Time Calculation (min) (ACUTE ONLY): 9 min  Assessment / Plan / Recommendation Clinical Impression  Pt was seen for skilled ST targeting dysphagia goals.  Pt had consumed the majority of breakfast tray upon therapist's arrival.  Per RN and pt, pt was not awaiting MRI as indicated in chart and cleared for POs.  Pt consumed regular textures and thin liquids without difficulty.   No overt s/s of aspiration evident with solids or liquids and pt was able to clear solids from the oral cavity post swallow.  Pt independent for use of swallowing precautions.  No reports of difficulty tolerating currently prescribed diet.  As a result, recommend discharging pt from [place] as he is at baseline for swallowing function.    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  Patient transferred to [facility] and MRI is pending.        SLP Plan  Other (Goals met, discharge from ST services)     Recommendations  Diet recommendations: Regular;Thin liquid Liquids provided via: Cup Medication Administration: Whole meds with liquid Supervision: Patient able to self feed Compensations: Slow rate;Small sips/bites             Oral Care Recommendations: Oral care BID Follow up Recommendations: None Plan: Other (Comment)     GO                Lajuan Godbee, Selinda Orion 12/11/2015, 9:30 AM

## 2015-12-11 NOTE — Progress Notes (Signed)
PROGRESS NOTE    Timothy Murillo  [id number] DOB: 1975-12-19 DOA: 12/10/2015 PCP: No PCP Per Patient   Outpatient Specialists:     Brief Narrative:  Left posterior parietal lobe mass in a 41 yo male with history of seizures in his childhood. He presented to the hospital with generalized tonic clonic seizure. Transferred to [facility]  from [facility] for further neurosurgery evaluation.    Assessment & Plan:   Principal Problem:   Seizure [facility]) Active Problems:   Mass of brain   Left posterior parietal lobe mass.  -MRI report: Solitary 17 mm intensely enhancing mass located along the periphery of the inferior left parietal lobe near the posterior temporal lobe. Coronal T2 images suggest the lesion could be extra-axial, but it does not have the typical non-contrast MRI signal of a meningioma.  Top differential considerations therefore are meningioma, solitary metastasis, and less likely a primary tumor such as high-grade glioma. -neurosurgery recommendations.  Continue high dose steroids with decadron IV.  -CT Scan done of chest/abd/pelvis  Seizures. Continue neuro checks, keppra and as needed lorazepam, continue to follow with neurology recommendations.    ETOH abuse.  -CIWA  DVT prophylaxis:  SCD's  Code Status: Full Code   Family Communication: Friend at bedside  Disposition Plan:     Consultants:   neurosurgery  Procedures:        Subjective: No current issue- eating well  Objective: Vitals:   12/11/15 0548 12/11/15 0613 12/11/15 0926 12/11/15 1405  BP: 122/60  (!) 116/56 122/68  Pulse: 60 64 67 71  Resp: 20  18 18   Temp: 97.6 F (36.4 C)  97.7 F (36.5 C) 98.8 F (37.1 C)  TempSrc: Oral  Oral Oral  SpO2: 100%  100% 100%  Weight:      Height:        Intake/Output Summary (Last 24 hours) at 12/11/15 1427 Last data filed at 12/11/15 0938  Gross per 24 hour  Intake              105 ml  Output                0 ml  Net              105 ml    Filed Weights   12/10/15 0209  Weight: 86.2 kg (190 lb)    Examination:  General exam: Appears calm and comfortable  Respiratory system: Clear to auscultation. Respiratory effort normal. Cardiovascular system: S1 & S2 heard, RRR. No JVD, murmurs, rubs, gallops or clicks. No pedal edema. Gastrointestinal system: Abdomen is nondistended, soft and nontender. No organomegaly or masses felt. Normal bowel sounds heard. Central nervous system: Alert and oriented. No focal neurological deficits. Extremities: Symmetric 5 x 5 power. 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 Mood & affect appropriate.     Data Reviewed: I have personally reviewed following labs and imaging studies  CBC:  Recent Labs Lab 12/10/15 0220 12/10/15 0408 12/11/15 0642  WBC 9.2  --  16.3*  NEUTROABS 3.8  --   --   HGB 14.0 14.3 13.9  HCT 42.3 42.0 42.6  MCV 85.1  --  86.9  PLT 183  --  [id number]   Basic Metabolic Panel:  Recent Labs Lab 12/10/15 0220 12/10/15 0408 12/11/15 0642  NA 138 143 140  K 4.6 3.4* 3.9  CL 105 103 107  CO2 23  --  25  GLUCOSE 119* 111* 139*  BUN 17 16 9   CREATININE 1.02 1.00 0.75  CALCIUM 9.0  --  9.2   GFR: Estimated Creatinine Clearance: 141.3 mL/min (by C-G formula based on SCr of 0.8 mg/dL). Liver Function Tests:  Recent Labs Lab 12/11/15 0642  AST 36  ALT 28  ALKPHOS 44  BILITOT 0.9  PROT 7.0  ALBUMIN 3.5   No results for input(s): LIPASE, AMYLASE in the last 168 hours. No results for input(s): AMMONIA in the last 168 hours. Coagulation Profile:  Recent Labs Lab 12/10/15 0543 12/10/15 1908 12/11/15 0642  INR >10.00* 1.05 1.04   Cardiac Enzymes: No results for input(s): CKTOTAL, CKMB, CKMBINDEX, TROPONINI in the last 168 hours. BNP (last 3 results) No results for input(s): PROBNP in the last 8760 hours. HbA1C: No results for input(s): HGBA1C in the  last 72 hours. CBG:  Recent Labs Lab 12/10/15 0225 12/10/15 0747 12/11/15 0807  GLUCAP 119* 113* 143*   Lipid Profile: No results for input(s): CHOL, HDL, LDLCALC, TRIG, CHOLHDL, LDLDIRECT in the last 72 hours. Thyroid Function Tests: No results for input(s): TSH, T4TOTAL, FREET4, T3FREE, THYROIDAB in the last 72 hours. Anemia Panel: No results for input(s): VITAMINB12, FOLATE, FERRITIN, TIBC, IRON, RETICCTPCT in the last 72 hours. Urine analysis:    Component Value Date/Time   COLORURINE YELLOW 12/10/2015 0416   APPEARANCEUR CLEAR 12/10/2015 0416   LABSPEC 1.025 12/10/2015 0416   PHURINE 6.0 12/10/2015 0416   GLUCOSEU NEGATIVE 12/10/2015 0416   HGBUR NEGATIVE 12/10/2015 0416   BILIRUBINUR NEGATIVE 12/10/2015 0416   KETONESUR NEGATIVE 12/10/2015 0416   PROTEINUR NEGATIVE 12/10/2015 0416   NITRITE NEGATIVE 12/10/2015 0416   LEUKOCYTESUR NEGATIVE 12/10/2015 0416     )No results found for this or any previous visit (from the past 240 hour(s)).    Anti-infectives    None       Radiology Studies: Ct Head Wo Contrast  Result Date: 12/10/2015 CLINICAL DATA:  Acute onset of convulsions. Hit right side of head. Initial encounter. EXAM: CT HEAD WITHOUT CONTRAST TECHNIQUE: Contiguous axial images were obtained from the base of the skull through the vertex without intravenous contrast. COMPARISON:  None. FINDINGS: A prominent vague area of decreased attenuation is noted at the white matter of the left posterior parietal lobe, concerning for underlying mass. MRI of the brain with contrast is recommended for further evaluation. There is no evidence of acute infarct or intra or extra-axial hemorrhage at this time. The posterior fossa, including the cerebellum, brainstem and fourth ventricle, is within normal limits. The third and lateral ventricles, and basal ganglia are unremarkable in appearance. No midline shift is seen. There is no evidence of fracture; visualized osseous structures  are unremarkable in appearance. The visualized portions of the orbits are within normal limits. The paranasal sinuses and mastoid air cells are well-aerated. No significant soft tissue abnormalities are seen. IMPRESSION: Prominent vague area of decreased attenuation at the white matter of the left posterior parietal lobe, concerning for underlying mass. MRI of the brain with contrast is recommended for further evaluation. These results were called by telephone at the time of interpretation on 12/10/2015 at [id number] am to Dr. Roxanne Mins, who verbally acknowledged these results. Electronically Signed   By: Garald Balding M.D.   On: 12/10/2015 04:42   Ct Chest W Contrast  Result Date: 12/11/2015 CLINICAL DATA:  Solitary brain mass possible metastatic disease, assess for primary lesion EXAM: CT CHEST, ABDOMEN, AND PELVIS WITH CONTRAST TECHNIQUE: Multidetector CT  imaging of the chest, abdomen and pelvis was performed following the standard protocol during bolus administration of intravenous contrast. CONTRAST:  15mL ISOVUE-300 IOPAMIDOL (ISOVUE-300) INJECTION 61% COMPARISON:  CT scan abdomen 10/30/2005 FINDINGS: CT CHEST FINDINGS Mediastinum/Lymph Nodes: No masses, pathologically enlarged lymph nodes, or other significant abnormality. No aortic aneurysm. No aortic dissection. Central pulmonary artery is unremarkable. There is no hilar adenopathy. No mediastinal hematoma. Central airways are patent. Images of the thoracic inlet are unremarkable. Lungs/Pleura: No pulmonary mass, infiltrate, or effusion. No pleural thickening or pleural plaques. No pulmonary emphysema. No bronchiectasis. No focal consolidation. No pulmonary edema. Musculoskeletal: No chest wall mass or suspicious bone lesions identified. No axillary adenopathy. CT ABDOMEN PELVIS FINDINGS Hepatobiliary: No masses or other significant abnormality. Pancreas: No mass, inflammatory changes, or other significant abnormality. Spleen: Within normal limits in size and  appearance. Adrenals/Urinary Tract: No masses identified. No evidence of hydronephrosis. No adrenal gland lesion. Delayed renal images shows bilateral renal symmetrical excretion. Bilateral visualized proximal ureter is unremarkable. Stomach/Bowel: No evidence of obstruction, inflammatory process, or abnormal fluid collections. No thickened or dilated small bowel loops. No pericecal inflammation. Normal appendix clearly visualize in axial image 91. The terminal ileum is unremarkable. Moderate stool noted throughout the colon. No obstructing or constricting colonic mass. Vascular/Lymphatic: No pathologically enlarged lymph nodes. No evidence of abdominal aortic aneurysm. Reproductive: No mass or other significant abnormality. Prostate gland and seminal vesicles are unremarkable. Other: The urinary bladder is unremarkable. No thickening of urinary bladder wall. No inguinal adenopathy. No ascites or free abdominal air. Musculoskeletal: No suspicious bone lesions identified. Sagittal images of the spine shows alignment and disc spaces to be preserved. Bilateral hip joints are unremarkable. IMPRESSION: 1. No evidence of pulmonary mass or adenopathy. 2. Acute infiltrate or pulmonary edema. 3. No acute inflammatory process within abdomen or pelvis. Moderate stool noted throughout the colon. No obstructing or constricting colonic mass. 4. No small bowel obstruction. 5. Normal appendix.  No pericecal inflammation. 6. No hydronephrosis or hydroureter. 7. Unremarkable prostate gland. 8. No ascites or free air.  No abdominal or pelvic adenopathy. Electronically Signed   By: Lahoma Crocker M.D.   On: 12/11/2015 13:45   Mr Jeri Cos [id number] Contrast  Result Date: 12/10/2015 CLINICAL DATA:  40 year old male with new onset seizure. Head CT revealed evidence of a left hemisphere mass with edema. Initial encounter. No other enhancing lesion identified. No dural thickening elsewhere. No other cerebral edema. Smoker. EXAM: MRI HEAD WITHOUT AND  WITH CONTRAST TECHNIQUE: Multiplanar, multiecho pulse sequences of the brain and surrounding structures were obtained without and with intravenous contrast. CONTRAST:  67mL MULTIHANCE GADOBENATE DIMEGLUMINE 529 MG/ML IV SOLN COMPARISON:  Head CT without contrast 0425 hours today. FINDINGS: There is a intensely enhancing lobulated mass along the periphery of the posterior left hemisphere measuring 17 x 13 x 17 mm (AP by transverse by CC). The lesion itself is T2 and FLAIR hyperintense, and T1 hypo intense on precontrast images. Diffusion is facilitated within the lesion. There is a 5 cm area of surrounding T2 and FLAIR hyperintensity compatible with vasogenic edema. It is difficult to confirm, but on coronal T2 weighted images this lesion appears to be extra-axial (series 10, image 2). It is located along the lateral aspect of the left parietal lobe, near the posterior temporal lobe (series 17, image 21). There might be a small associated dural tail demonstrated on series 16, image 7. There is mild if any associated hemosiderin or mineralization of the lesion along its posterior  margin as seen on series 8, image 59. No associated midline shift. No restricted diffusion to suggest acute infarction. No acute intracranial hemorrhage. Cervicomedullary junction and pituitary are within normal limits. Grossly negative visualized cervical spine and spinal cord. Visible bone marrow signal is normal. Major intracranial vascular flow voids are preserved. Outside of the posterior left hemisphere cerebral edema gray and white matter signal is normal for age. Visible internal auditory structures appear normal. Paranasal sinuses and mastoids are well pneumatized; mild maxillary sinus mucosal thickening. Negative orbit and scalp soft tissues. IMPRESSION: 1. Solitary 17 mm intensely enhancing mass located along the periphery of the inferior left parietal lobe near the posterior temporal lobe. Coronal T2 images suggest the lesion  could be extra-axial, but it does not have the typical non-contrast MRI signal of a meningioma. Top differential considerations therefore are meningioma, solitary metastasis, and less likely a primary tumor such as high-grade glioma. Considering this differential and the patient's history of smoking recommend CT chest abdomen and pelvis (contrast preferred). 2. Surrounding vasogenic edema without significant intracranial mass effect stable from the earlier CT. Electronically Signed   By: Genevie Ann M.D.   On: 12/10/2015 17:06   Ct Abdomen Pelvis W Contrast  Result Date: 12/11/2015 CLINICAL DATA:  Solitary brain mass possible metastatic disease, assess for primary lesion EXAM: CT CHEST, ABDOMEN, AND PELVIS WITH CONTRAST TECHNIQUE: Multidetector CT imaging of the chest, abdomen and pelvis was performed following the standard protocol during bolus administration of intravenous contrast. CONTRAST:  148mL ISOVUE-300 IOPAMIDOL (ISOVUE-300) INJECTION 61% COMPARISON:  CT scan abdomen 10/30/2005 FINDINGS: CT CHEST FINDINGS Mediastinum/Lymph Nodes: No masses, pathologically enlarged lymph nodes, or other significant abnormality. No aortic aneurysm. No aortic dissection. Central pulmonary artery is unremarkable. There is no hilar adenopathy. No mediastinal hematoma. Central airways are patent. Images of the thoracic inlet are unremarkable. Lungs/Pleura: No pulmonary mass, infiltrate, or effusion. No pleural thickening or pleural plaques. No pulmonary emphysema. No bronchiectasis. No focal consolidation. No pulmonary edema. Musculoskeletal: No chest wall mass or suspicious bone lesions identified. No axillary adenopathy. CT ABDOMEN PELVIS FINDINGS Hepatobiliary: No masses or other significant abnormality. Pancreas: No mass, inflammatory changes, or other significant abnormality. Spleen: Within normal limits in size and appearance. Adrenals/Urinary Tract: No masses identified. No evidence of hydronephrosis. No adrenal gland  lesion. Delayed renal images shows bilateral renal symmetrical excretion. Bilateral visualized proximal ureter is unremarkable. Stomach/Bowel: No evidence of obstruction, inflammatory process, or abnormal fluid collections. No thickened or dilated small bowel loops. No pericecal inflammation. Normal appendix clearly visualize in axial image 91. The terminal ileum is unremarkable. Moderate stool noted throughout the colon. No obstructing or constricting colonic mass. Vascular/Lymphatic: No pathologically enlarged lymph nodes. No evidence of abdominal aortic aneurysm. Reproductive: No mass or other significant abnormality. Prostate gland and seminal vesicles are unremarkable. Other: The urinary bladder is unremarkable. No thickening of urinary bladder wall. No inguinal adenopathy. No ascites or free abdominal air. Musculoskeletal: No suspicious bone lesions identified. Sagittal images of the spine shows alignment and disc spaces to be preserved. Bilateral hip joints are unremarkable. IMPRESSION: 1. No evidence of pulmonary mass or adenopathy. 2. Acute infiltrate or pulmonary edema. 3. No acute inflammatory process within abdomen or pelvis. Moderate stool noted throughout the colon. No obstructing or constricting colonic mass. 4. No small bowel obstruction. 5. Normal appendix.  No pericecal inflammation. 6. No hydronephrosis or hydroureter. 7. Unremarkable prostate gland. 8. No ascites or free air.  No abdominal or pelvic adenopathy. Electronically Signed   By:  Lahoma Crocker M.D.   On: 12/11/2015 13:45        Scheduled Meds: . dexamethasone  4 mg Intravenous [id number]  . folic acid  1 mg Oral Daily  . levETIRAcetam  500 mg Intravenous Q12H  . LORazepam  0-4 mg Intravenous Q6H   Followed by  . [START ON 12/12/2015] LORazepam  0-4 mg Intravenous Q12H  . multivitamin with minerals  1 tablet Oral Daily  . sodium chloride flush  3 mL Intravenous Q12H  . thiamine  100 mg Oral Daily   Continuous Infusions:    LOS: 1  day    Time spent: 25 min    Leroy, DO [facility] Pager [phone]  If 7PM-7AM, please contact night-coverage [email or web address] Password [email or web address] 12/11/2015, 2:27 PM

## 2015-12-11 NOTE — Progress Notes (Signed)
Patient ID: Timothy Murillo, male   DOB: 1976/02/17, 40 y.o.   MRN: [id number] Patient well without complaints this morning feels better on the steroids.  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 However CT scans of chest and pelvis appear to be negative for I will contact radiation oncology we will consider preoperative stereotactic radiosurgery with subsequent stereotactic craniotomy for resection. We will await their evaluation and schedule accordingly.

## 2015-12-12 ENCOUNTER — Encounter: Payer: Self-pay | Admitting: Radiation Therapy

## 2015-12-12 ENCOUNTER — Other Ambulatory Visit: Payer: Self-pay | Admitting: Neurosurgery

## 2015-12-12 ENCOUNTER — Ambulatory Visit
Admit: 2015-12-12 | Discharge: 2015-12-12 | Disposition: A | Discharge status: Home / Self Care | Payer: PRIVATE HEALTH INSURANCE | Attending: Radiation Oncology | Admitting: Radiation Oncology

## 2015-12-12 DIAGNOSIS — G9389 Other specified disorders of brain: Secondary | ICD-10-CM

## 2015-12-12 DIAGNOSIS — R569 Unspecified convulsions: Secondary | ICD-10-CM

## 2015-12-12 LAB — GLUCOSE, CAPILLARY: GLUCOSE-CAPILLARY: 126 mg/dL — AB (ref 65–99)

## 2015-12-12 MED ORDER — LEVETIRACETAM 500 MG PO TABS: 500.0000 mg | ORAL_TABLET | Freq: Two times a day (BID) | ORAL | 0 refills | Status: AC

## 2015-12-12 MED ORDER — DEXAMETHASONE 4 MG PO TABS: 4.0000 mg | ORAL_TABLET | Freq: Two times a day (BID) | ORAL | Status: DC

## 2015-12-12 MED ORDER — DEXAMETHASONE 4 MG PO TABS: 4.0000 mg | ORAL_TABLET | Freq: Two times a day (BID) | ORAL | 0 refills | Status: DC

## 2015-12-12 MED ORDER — LEVETIRACETAM 500 MG PO TABS: 500.0000 mg | ORAL_TABLET | Freq: Two times a day (BID) | ORAL | Status: DC

## 2015-12-12 NOTE — Progress Notes (Signed)
Per Dr. Windy Carina request, Pre-OP [facility] treatment planning has been cancelled. He has decided to go forward with surgery alone to confirm path and then if necessary to plan on radiation to the surgical cavity. I have passed this along to Dr. Lisbeth Renshaw and Shona Simpson and will add him to the conference list for 8/21 to discuss path from his 8/16 surgery.   Mont Dutton R.T.(R)(T) Special Procedures Navigator

## 2015-12-12 NOTE — Consult Note (Signed)
Radiation Oncology         [phone] ________________________________  Name: Timothy Murillo MRN: [id number]  Date: 12/10/2015  DOB: 02/06/1976  CC:No PCP Per Patient  No ref. provider found     REFERRING PHYSICIAN: No ref. provider found   DIAGNOSIS: The primary encounter diagnosis was Seizure [facility]). Diagnoses of Mass of brain, Brain metastases [facility]), Brain metastases ([place]), and Brain mass were also pertinent to this visit.   HISTORY OF PRESENT ILLNESS: Timothy Murillo is a 40 y.o. male seen at the request of Dr. Saintclair Halsted for a newly noted lesion of the brain for discussion of [place] treatment and surgical resection. The patient has been healthy all his life and denies taking medication regularly for any condition. He had a seizure on the day of his presentation to the ED and was found on CT to have a lesion in the left parietal lobe. An MRI of the brain confirmed a 17 x 13 x 17 mm lesion in the left parietal region. He has had a CT chest, abdomen, and pelvis which do not reveal any abnormalities. He has plans to move forward with SRS and surgery to this lesion.    PREVIOUS RADIATION THERAPY: No   PAST MEDICAL HISTORY:  Past Medical History:  Diagnosis Date  . Hernia of abdominal wall   . Seizures ([place])        PAST SURGICAL HISTORY: Past Surgical History:  Procedure Laterality Date  . ABDOMINAL SURGERY     hernia  . WISDOM TOOTH EXTRACTION       FAMILY HISTORY:  Family History  Problem Relation Age of Onset  . Hypertension Mother   . Hypertension Father   . Brain cancer Maternal Uncle      SOCIAL HISTORY:  reports that he has been smoking Cigarettes.  He has never used smokeless tobacco. He reports that he drinks alcohol. He reports that he does not use drugs. The patient is in a relationship. He has 4 children ranging from teenagers to a 65 year old. He works at [employer].   ALLERGIES: Review of patient's allergies indicates no known allergies.   MEDICATIONS:  No  current facility-administered medications for this encounter.    Current Outpatient Prescriptions  Medication Sig Dispense Refill  . dexamethasone (DECADRON) 4 MG tablet Take 1 tablet (4 mg total) by mouth every 12 (twelve) hours. 60 tablet 0  . levETIRAcetam (KEPPRA) 500 MG tablet Take 1 tablet (500 mg total) by mouth 2 (two) times daily. 60 tablet 0     REVIEW OF SYSTEMS: On review of systems, the patient reports that he is feeling better since his admission. He denies any headaches, visual or auditory disturbances. He denies any headaches, chest pain, shortness of breath, cough, fevers, chills, night sweats, unintended weight changes. He denies any bowel or bladder disturbances, and denies abdominal pain, nausea or vomiting. He denies any new musculoskeletal or joint aches or pains. A complete review of systems is obtained and is otherwise negative.     PHYSICAL EXAM:  height is _0  (1.88 m) and weight is 190 lb (86.2 kg). His oral temperature is 98.4 F (36.9 C). His blood pressure is 126/80 and his pulse is 65. His respiration is 18 and oxygen saturation is 99%.   Pain scale 0*/10 In general this is a well appearing African American male in no acute distress. He is alert and oriented x4 and appropriate throughout the examination. HEENT reveals that the patient is normocephalic,  atraumatic. EOMs are intact. PERRLA. Skin is intact without any evidence of gross lesions. Cardiovascular exam reveals a regular rate and rhythm, no clicks rubs or murmurs are auscultated. Chest is clear to auscultation bilaterally. Lymphatic assessment is performed and does not reveal any adenopathy in the cervical, supraclavicular, axillary, or inguinal chains. Abdomen has active bowel sounds in all quadrants and is intact. The abdomen is soft, non tender, non distended. Lower extremities are negative for pretibial pitting edema, deep calf tenderness, cyanosis or clubbing.   ECOG = 1  0 - Asymptomatic (Fully  active, able to carry on all predisease activities without restriction)  1 - Symptomatic but completely ambulatory (Restricted in physically strenuous activity but ambulatory and able to carry out work of a light or sedentary nature. For example, light housework, office work)  2 - Symptomatic, <50% in bed during the day (Ambulatory and capable of all self care but unable to carry out any work activities. Up and about more than 50% of waking hours)  3 - Symptomatic, >50% in bed, but not bedbound (Capable of only limited self-care, confined to bed or chair 50% or more of waking hours)  4 - Bedbound (Completely disabled. Cannot carry on any self-care. Totally confined to bed or chair)  5 - Death   Eustace Pen MM, Creech RH, Tormey DC, et al. [phone]). "Toxicity and response criteria of the [facility]". [place] Oncol. 5 (6): 649-55    LABORATORY DATA:  Lab Results  Component Value Date   WBC 16.3 (H) 12/11/2015   HGB 13.9 12/11/2015   HCT 42.6 12/11/2015   MCV 86.9 12/11/2015   PLT 378 12/11/2015   Lab Results  Component Value Date   NA 140 12/11/2015   K 3.9 12/11/2015   CL 107 12/11/2015   CO2 25 12/11/2015   Lab Results  Component Value Date   ALT 28 12/11/2015   AST 36 12/11/2015   ALKPHOS 44 12/11/2015   BILITOT 0.9 12/11/2015      RADIOGRAPHY: Ct Head Wo Contrast  Result Date: 12/10/2015 CLINICAL DATA:  Acute onset of convulsions. Hit right side of head. Initial encounter. EXAM: CT HEAD WITHOUT CONTRAST TECHNIQUE: Contiguous axial images were obtained from the base of the skull through the vertex without intravenous contrast. COMPARISON:  None. FINDINGS: A prominent vague area of decreased attenuation is noted at the white matter of the left posterior parietal lobe, concerning for underlying mass. MRI of the brain with contrast is recommended for further evaluation. There is no evidence of acute infarct or intra or extra-axial hemorrhage at this time.  The posterior fossa, including the cerebellum, brainstem and fourth ventricle, is within normal limits. The third and lateral ventricles, and basal ganglia are unremarkable in appearance. No midline shift is seen. There is no evidence of fracture; visualized osseous structures are unremarkable in appearance. The visualized portions of the orbits are within normal limits. The paranasal sinuses and mastoid air cells are well-aerated. No significant soft tissue abnormalities are seen. IMPRESSION: Prominent vague area of decreased attenuation at the white matter of the left posterior parietal lobe, concerning for underlying mass. MRI of the brain with contrast is recommended for further evaluation. These results were called by telephone at the time of interpretation on 12/10/2015 at 4:23 am to Dr. Roxanne Mins, who verbally acknowledged these results. Electronically Signed   By: Garald Balding M.D.   On: 12/10/2015 04:42   Ct Chest W Contrast  Result Date: 12/11/2015 CLINICAL DATA:  Solitary brain mass possible metastatic disease, assess for primary lesion EXAM: CT CHEST, ABDOMEN, AND PELVIS WITH CONTRAST TECHNIQUE: Multidetector CT imaging of the chest, abdomen and pelvis was performed following the standard protocol during bolus administration of intravenous contrast. CONTRAST:  160m ISOVUE-300 IOPAMIDOL (ISOVUE-300) INJECTION 61% COMPARISON:  CT scan abdomen 10/30/2005 FINDINGS: CT CHEST FINDINGS Mediastinum/Lymph Nodes: No masses, pathologically enlarged lymph nodes, or other significant abnormality. No aortic aneurysm. No aortic dissection. Central pulmonary artery is unremarkable. There is no hilar adenopathy. No mediastinal hematoma. Central airways are patent. Images of the thoracic inlet are unremarkable. Lungs/Pleura: No pulmonary mass, infiltrate, or effusion. No pleural thickening or pleural plaques. No pulmonary emphysema. No bronchiectasis. No focal consolidation. No pulmonary edema. Musculoskeletal: No chest  wall mass or suspicious bone lesions identified. No axillary adenopathy. CT ABDOMEN PELVIS FINDINGS Hepatobiliary: No masses or other significant abnormality. Pancreas: No mass, inflammatory changes, or other significant abnormality. Spleen: Within normal limits in size and appearance. Adrenals/Urinary Tract: No masses identified. No evidence of hydronephrosis. No adrenal gland lesion. Delayed renal images shows bilateral renal symmetrical excretion. Bilateral visualized proximal ureter is unremarkable. Stomach/Bowel: No evidence of obstruction, inflammatory process, or abnormal fluid collections. No thickened or dilated small bowel loops. No pericecal inflammation. Normal appendix clearly visualize in axial image 91. The terminal ileum is unremarkable. Moderate stool noted throughout the colon. No obstructing or constricting colonic mass. Vascular/Lymphatic: No pathologically enlarged lymph nodes. No evidence of abdominal aortic aneurysm. Reproductive: No mass or other significant abnormality. Prostate gland and seminal vesicles are unremarkable. Other: The urinary bladder is unremarkable. No thickening of urinary bladder wall. No inguinal adenopathy. No ascites or free abdominal air. Musculoskeletal: No suspicious bone lesions identified. Sagittal images of the spine shows alignment and disc spaces to be preserved. Bilateral hip joints are unremarkable. IMPRESSION: 1. No evidence of pulmonary mass or adenopathy. 2. Acute infiltrate or pulmonary edema. 3. No acute inflammatory process within abdomen or pelvis. Moderate stool noted throughout the colon. No obstructing or constricting colonic mass. 4. No small bowel obstruction. 5. Normal appendix.  No pericecal inflammation. 6. No hydronephrosis or hydroureter. 7. Unremarkable prostate gland. 8. No ascites or free air.  No abdominal or pelvic adenopathy. Electronically Signed   By: LLahoma CrockerM.D.   On: 12/11/2015 13:45   Mr BJeri CosWZOContrast  Result Date:  12/10/2015 CLINICAL DATA:  40year old male with new onset seizure. Head CT revealed evidence of a left hemisphere mass with edema. Initial encounter. No other enhancing lesion identified. No dural thickening elsewhere. No other cerebral edema. Smoker. EXAM: MRI HEAD WITHOUT AND WITH CONTRAST TECHNIQUE: Multiplanar, multiecho pulse sequences of the brain and surrounding structures were obtained without and with intravenous contrast. CONTRAST:  167mMULTIHANCE GADOBENATE DIMEGLUMINE 529 MG/ML IV SOLN COMPARISON:  Head CT without contrast 0425 hours today. FINDINGS: There is a intensely enhancing lobulated mass along the periphery of the posterior left hemisphere measuring 17 x 13 x 17 mm (AP by transverse by CC). The lesion itself is T2 and FLAIR hyperintense, and T1 hypo intense on precontrast images. Diffusion is facilitated within the lesion. There is a 5 cm area of surrounding T2 and FLAIR hyperintensity compatible with vasogenic edema. It is difficult to confirm, but on coronal T2 weighted images this lesion appears to be extra-axial (series 10, image 2). It is located along the lateral aspect of the left parietal lobe, near the posterior temporal lobe (series 17, image 21). There might be a small associated dural tail  demonstrated on series 16, image 7. There is mild if any associated hemosiderin or mineralization of the lesion along its posterior margin as seen on series 8, image 59. No associated midline shift. No restricted diffusion to suggest acute infarction. No acute intracranial hemorrhage. Cervicomedullary junction and pituitary are within normal limits. Grossly negative visualized cervical spine and spinal cord. Visible bone marrow signal is normal. Major intracranial vascular flow voids are preserved. Outside of the posterior left hemisphere cerebral edema gray and white matter signal is normal for age. Visible internal auditory structures appear normal. Paranasal sinuses and mastoids are well  pneumatized; mild maxillary sinus mucosal thickening. Negative orbit and scalp soft tissues. IMPRESSION: 1. Solitary 17 mm intensely enhancing mass located along the periphery of the inferior left parietal lobe near the posterior temporal lobe. Coronal T2 images suggest the lesion could be extra-axial, but it does not have the typical non-contrast MRI signal of a meningioma. Top differential considerations therefore are meningioma, solitary metastasis, and less likely a primary tumor such as high-grade glioma. Considering this differential and the patient's history of smoking recommend CT chest abdomen and pelvis (contrast preferred). 2. Surrounding vasogenic edema without significant intracranial mass effect stable from the earlier CT. Electronically Signed   By: Genevie Ann M.D.   On: 12/10/2015 17:06   Ct Abdomen Pelvis W Contrast  Result Date: 12/11/2015 CLINICAL DATA:  Solitary brain mass possible metastatic disease, assess for primary lesion EXAM: CT CHEST, ABDOMEN, AND PELVIS WITH CONTRAST TECHNIQUE: Multidetector CT imaging of the chest, abdomen and pelvis was performed following the standard protocol during bolus administration of intravenous contrast. CONTRAST:  133m ISOVUE-300 IOPAMIDOL (ISOVUE-300) INJECTION 61% COMPARISON:  CT scan abdomen 10/30/2005 FINDINGS: CT CHEST FINDINGS Mediastinum/Lymph Nodes: No masses, pathologically enlarged lymph nodes, or other significant abnormality. No aortic aneurysm. No aortic dissection. Central pulmonary artery is unremarkable. There is no hilar adenopathy. No mediastinal hematoma. Central airways are patent. Images of the thoracic inlet are unremarkable. Lungs/Pleura: No pulmonary mass, infiltrate, or effusion. No pleural thickening or pleural plaques. No pulmonary emphysema. No bronchiectasis. No focal consolidation. No pulmonary edema. Musculoskeletal: No chest wall mass or suspicious bone lesions identified. No axillary adenopathy. CT ABDOMEN PELVIS FINDINGS  Hepatobiliary: No masses or other significant abnormality. Pancreas: No mass, inflammatory changes, or other significant abnormality. Spleen: Within normal limits in size and appearance. Adrenals/Urinary Tract: No masses identified. No evidence of hydronephrosis. No adrenal gland lesion. Delayed renal images shows bilateral renal symmetrical excretion. Bilateral visualized proximal ureter is unremarkable. Stomach/Bowel: No evidence of obstruction, inflammatory process, or abnormal fluid collections. No thickened or dilated small bowel loops. No pericecal inflammation. Normal appendix clearly visualize in axial image 91. The terminal ileum is unremarkable. Moderate stool noted throughout the colon. No obstructing or constricting colonic mass. Vascular/Lymphatic: No pathologically enlarged lymph nodes. No evidence of abdominal aortic aneurysm. Reproductive: No mass or other significant abnormality. Prostate gland and seminal vesicles are unremarkable. Other: The urinary bladder is unremarkable. No thickening of urinary bladder wall. No inguinal adenopathy. No ascites or free abdominal air. Musculoskeletal: No suspicious bone lesions identified. Sagittal images of the spine shows alignment and disc spaces to be preserved. Bilateral hip joints are unremarkable. IMPRESSION: 1. No evidence of pulmonary mass or adenopathy. 2. Acute infiltrate or pulmonary edema. 3. No acute inflammatory process within abdomen or pelvis. Moderate stool noted throughout the colon. No obstructing or constricting colonic mass. 4. No small bowel obstruction. 5. Normal appendix.  No pericecal inflammation. 6. No hydronephrosis or hydroureter.  7. Unremarkable prostate gland. 8. No ascites or free air.  No abdominal or pelvic adenopathy. Electronically Signed   By: Lahoma Crocker M.D.   On: 12/11/2015 13:45       IMPRESSION:  Solitary brain lesion   PLAN:  I met with the patient this morning to discuss Dr. Ida Rogue recommendations to move  forward with T3 MRI with SRS protocol and [facility] planning. We reviewed his MRI during discussion, and I have outline the planning session and described the process. I have called in Ativan .5 mg #30, no refills for him to use 30 minutes prior to planning or radiation, or q 4-6 hrs prn nausea or anxiety. We will perform simulation Monday, and proceeded with SRS on Wednesday at noon, and subsequent surgery on Thursday 12/19/15. We will fill out any FMLA paperwork on Monday as well. He understands he may be medically cleared for discharge prior to Monday.     Carola Rhine, PAC   Addendum:  After meeting with the patient this morning, Dr. Saintclair Halsted contacted [place] to let us know that he has reconsidered this case, and rather than move forward with radition first, would like to obtain tissue confirmation of the nature of the patient's finding. He will proceed with surgery first, then consider SRS radiotherapy in the postoperative setting.    Carola Rhine, PAC

## 2015-12-12 NOTE — Care Management Note (Signed)
Case Management Note  Patient Details  Name: Timothy Murillo MRN: [id number] Date of Birth: 08-28-1975  Subjective/Objective:                    Action/Plan: Pt discharging home with self care. No further needs per CM.   Expected Discharge Date:                  Expected Discharge Plan:  Home/Self Care  In-House Referral:     Discharge planning Services     Post Acute Care Choice:    Choice offered to:     DME Arranged:    DME Agency:     HH Arranged:    [place] Agency:     Status of Service:  Completed, signed off  If discussed at [employer] of Stay Meetings, dates discussed:    Additional Comments:  Pollie Friar, RN 12/12/2015, 12:55 PM

## 2015-12-12 NOTE — Discharge Summary (Signed)
Physician Discharge Summary  Timothy Murillo [id number] DOB: October 17, 1975 DOA: 12/10/2015  PCP: No PCP Per Patient  Admit date: 12/10/2015 Discharge date: 12/12/2015   Recommendations for Outpatient Follow-Up:   1. Outpatient neurosurgery follow up on Thursday 2. 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   Discharge Condition: Improved.  Diet recommendation:   Regular.  Wound care: None.   History of Present Illness:   Timothy Murillo is a 40 y.o. male with medical history significant of seizure in childhood, who presents with seizure.   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 He fell to the ground, then began convulsing. He had LOC. Not sure how long it lasted. Pt states that he injured his left side of head and has HA in the same location. Pt reports history of seizures as a child. He does not have vision change, hearing loss, unilateral weakness, numbness or tingling is in extremities. No incontinence of bowel movement or urination. Patient denies fever, chills, chest pain, shortness of breath, cough, nausea, vomiting, diarrhea, abdominal pain. No symptoms of UTI. Patient reports that he drinks 3-6 beer 3-6 each time, 3-4 times a week, but never had withdraw problem.    Hospital Course by Problem:   Left posterior parietal lobe mass.  -MRI report: Solitary 17 mm intensely enhancing mass located along the periphery of the inferior left parietal lobe near the posterior temporal lobe. Coronal T2 images suggest the lesion could be extra-axial, but it does not have the typical non-contrast MRI signal of a meningioma.  Top differential considerations therefore are meningioma, solitary metastasis, and less likely a primary tumor such as high-grade glioma-- could also  be infectious -neurosurgery recommendations- for surgery on Thursday PO decadron BID -CT Scan done of chest/abd/pelvis- no sign of primary lesion  Seizures.  -keppra -outpatient follow up   ETOH abuse.  -cessation    Medical Consultants:    NS   Discharge Exam:   Vitals:   12/12/15 0540 12/12/15 0939  BP: 124/65 128/66  Pulse: 61 (!) 50  Resp: 20 18  Temp: 97.8 F (36.6 C) 98.2 F (36.8 C)   Vitals:   12/11/15 2142 12/12/15 0051 12/12/15 0540 12/12/15 0939  BP: 128/70 126/68 124/65 128/66  Pulse: 60 66 61 (!) 50  Resp: 20 20 20 18   Temp: 97.8 F (36.6 C) 97.4 F (36.3 C) 97.8 F (36.6 C) 98.2 F (36.8 C)  TempSrc: Oral Oral Oral Oral  SpO2: 100% 100% 100% 96%  Weight:      Height:        Gen:  NAD    The results of significant diagnostics from this hospitalization (including imaging, microbiology, ancillary and laboratory) are listed below for reference.     Procedures and Diagnostic Studies:   Ct Head Wo Contrast  Result Date: 12/10/2015 CLINICAL DATA:  Acute onset of convulsions. Hit right side of head. Initial encounter. EXAM: CT HEAD WITHOUT CONTRAST TECHNIQUE: Contiguous axial images were obtained from the base of the skull through the vertex without intravenous contrast. COMPARISON:  None. FINDINGS: A prominent vague area of decreased attenuation is noted at the white matter of the left posterior parietal lobe, concerning for underlying mass. MRI of the brain with contrast is  recommended for further evaluation. There is no evidence of acute infarct or intra or extra-axial hemorrhage at this time. The posterior fossa, including the cerebellum, brainstem and fourth ventricle, is within normal limits. The third and lateral ventricles, and basal ganglia are unremarkable in appearance. No midline shift is seen. There is no evidence of fracture; visualized osseous structures are unremarkable in appearance. The visualized portions of the orbits are within  normal limits. The paranasal sinuses and mastoid air cells are well-aerated. No significant soft tissue abnormalities are seen. IMPRESSION: Prominent vague area of decreased attenuation at the white matter of the left posterior parietal lobe, concerning for underlying mass. MRI of the brain with contrast is recommended for further evaluation. These results were called by telephone at the time of interpretation on 12/10/2015 at [id number] am to Dr. Roxanne Mins, who verbally acknowledged these results. Electronically Signed   By: Garald Balding M.D.   On: 12/10/2015 04:42   Ct Chest W Contrast  Result Date: 12/11/2015 CLINICAL DATA:  Solitary brain mass possible metastatic disease, assess for primary lesion EXAM: CT CHEST, ABDOMEN, AND PELVIS WITH CONTRAST TECHNIQUE: Multidetector CT imaging of the chest, abdomen and pelvis was performed following the standard protocol during bolus administration of intravenous contrast. CONTRAST:  137mL ISOVUE-300 IOPAMIDOL (ISOVUE-300) INJECTION 61% COMPARISON:  CT scan abdomen 10/30/2005 FINDINGS: CT CHEST FINDINGS Mediastinum/Lymph Nodes: No masses, pathologically enlarged lymph nodes, or other significant abnormality. No aortic aneurysm. No aortic dissection. Central pulmonary artery is unremarkable. There is no hilar adenopathy. No mediastinal hematoma. Central airways are patent. Images of the thoracic inlet are unremarkable. Lungs/Pleura: No pulmonary mass, infiltrate, or effusion. No pleural thickening or pleural plaques. No pulmonary emphysema. No bronchiectasis. No focal consolidation. No pulmonary edema. Musculoskeletal: No chest wall mass or suspicious bone lesions identified. No axillary adenopathy. CT ABDOMEN PELVIS FINDINGS Hepatobiliary: No masses or other significant abnormality. Pancreas: No mass, inflammatory changes, or other significant abnormality. Spleen: Within normal limits in size and appearance. Adrenals/Urinary Tract: No masses identified. No evidence of  hydronephrosis. No adrenal gland lesion. Delayed renal images shows bilateral renal symmetrical excretion. Bilateral visualized proximal ureter is unremarkable. Stomach/Bowel: No evidence of obstruction, inflammatory process, or abnormal fluid collections. No thickened or dilated small bowel loops. No pericecal inflammation. Normal appendix clearly visualize in axial image 91. The terminal ileum is unremarkable. Moderate stool noted throughout the colon. No obstructing or constricting colonic mass. Vascular/Lymphatic: No pathologically enlarged lymph nodes. No evidence of abdominal aortic aneurysm. Reproductive: No mass or other significant abnormality. Prostate gland and seminal vesicles are unremarkable. Other: The urinary bladder is unremarkable. No thickening of urinary bladder wall. No inguinal adenopathy. No ascites or free abdominal air. Musculoskeletal: No suspicious bone lesions identified. Sagittal images of the spine shows alignment and disc spaces to be preserved. Bilateral hip joints are unremarkable. IMPRESSION: 1. No evidence of pulmonary mass or adenopathy. 2. Acute infiltrate or pulmonary edema. 3. No acute inflammatory process within abdomen or pelvis. Moderate stool noted throughout the colon. No obstructing or constricting colonic mass. 4. No small bowel obstruction. 5. Normal appendix.  No pericecal inflammation. 6. No hydronephrosis or hydroureter. 7. Unremarkable prostate gland. 8. No ascites or free air.  No abdominal or pelvic adenopathy. Electronically Signed   By: Lahoma Crocker M.D.   On: 12/11/2015 13:45   Mr Jeri Cos [id number] Contrast  Result Date: 12/10/2015 CLINICAL DATA:  40 year old male with new onset seizure. Head CT revealed evidence of a left hemisphere mass with edema. Initial encounter. No  other enhancing lesion identified. No dural thickening elsewhere. No other cerebral edema. Smoker. EXAM: MRI HEAD WITHOUT AND WITH CONTRAST TECHNIQUE: Multiplanar, multiecho pulse sequences of the  brain and surrounding structures were obtained without and with intravenous contrast. CONTRAST:  43mL MULTIHANCE GADOBENATE DIMEGLUMINE 529 MG/ML IV SOLN COMPARISON:  Head CT without contrast 0425 hours today. FINDINGS: There is a intensely enhancing lobulated mass along the periphery of the posterior left hemisphere measuring 17 x 13 x 17 mm (AP by transverse by CC). The lesion itself is T2 and FLAIR hyperintense, and T1 hypo intense on precontrast images. Diffusion is facilitated within the lesion. There is a 5 cm area of surrounding T2 and FLAIR hyperintensity compatible with vasogenic edema. It is difficult to confirm, but on coronal T2 weighted images this lesion appears to be extra-axial (series 10, image 2). It is located along the lateral aspect of the left parietal lobe, near the posterior temporal lobe (series 17, image 21). There might be a small associated dural tail demonstrated on series 16, image 7. There is mild if any associated hemosiderin or mineralization of the lesion along its posterior margin as seen on series 8, image 59. No associated midline shift. No restricted diffusion to suggest acute infarction. No acute intracranial hemorrhage. Cervicomedullary junction and pituitary are within normal limits. Grossly negative visualized cervical spine and spinal cord. Visible bone marrow signal is normal. Major intracranial vascular flow voids are preserved. Outside of the posterior left hemisphere cerebral edema gray and white matter signal is normal for age. Visible internal auditory structures appear normal. Paranasal sinuses and mastoids are well pneumatized; mild maxillary sinus mucosal thickening. Negative orbit and scalp soft tissues. IMPRESSION: 1. Solitary 17 mm intensely enhancing mass located along the periphery of the inferior left parietal lobe near the posterior temporal lobe. Coronal T2 images suggest the lesion could be extra-axial, but it does not have the typical non-contrast MRI  signal of a meningioma. Top differential considerations therefore are meningioma, solitary metastasis, and less likely a primary tumor such as high-grade glioma. Considering this differential and the patient's history of smoking recommend CT chest abdomen and pelvis (contrast preferred). 2. Surrounding vasogenic edema without significant intracranial mass effect stable from the earlier CT. Electronically Signed   By: Genevie Ann M.D.   On: 12/10/2015 17:06   Ct Abdomen Pelvis W Contrast  Result Date: 12/11/2015 CLINICAL DATA:  Solitary brain mass possible metastatic disease, assess for primary lesion EXAM: CT CHEST, ABDOMEN, AND PELVIS WITH CONTRAST TECHNIQUE: Multidetector CT imaging of the chest, abdomen and pelvis was performed following the standard protocol during bolus administration of intravenous contrast. CONTRAST:  141mL ISOVUE-300 IOPAMIDOL (ISOVUE-300) INJECTION 61% COMPARISON:  CT scan abdomen 10/30/2005 FINDINGS: CT CHEST FINDINGS Mediastinum/Lymph Nodes: No masses, pathologically enlarged lymph nodes, or other significant abnormality. No aortic aneurysm. No aortic dissection. Central pulmonary artery is unremarkable. There is no hilar adenopathy. No mediastinal hematoma. Central airways are patent. Images of the thoracic inlet are unremarkable. Lungs/Pleura: No pulmonary mass, infiltrate, or effusion. No pleural thickening or pleural plaques. No pulmonary emphysema. No bronchiectasis. No focal consolidation. No pulmonary edema. Musculoskeletal: No chest wall mass or suspicious bone lesions identified. No axillary adenopathy. CT ABDOMEN PELVIS FINDINGS Hepatobiliary: No masses or other significant abnormality. Pancreas: No mass, inflammatory changes, or other significant abnormality. Spleen: Within normal limits in size and appearance. Adrenals/Urinary Tract: No masses identified. No evidence of hydronephrosis. No adrenal gland lesion. Delayed renal images shows bilateral renal symmetrical excretion.  Bilateral visualized proximal  ureter is unremarkable. Stomach/Bowel: No evidence of obstruction, inflammatory process, or abnormal fluid collections. No thickened or dilated small bowel loops. No pericecal inflammation. Normal appendix clearly visualize in axial image 91. The terminal ileum is unremarkable. Moderate stool noted throughout the colon. No obstructing or constricting colonic mass. Vascular/Lymphatic: No pathologically enlarged lymph nodes. No evidence of abdominal aortic aneurysm. Reproductive: No mass or other significant abnormality. Prostate gland and seminal vesicles are unremarkable. Other: The urinary bladder is unremarkable. No thickening of urinary bladder wall. No inguinal adenopathy. No ascites or free abdominal air. Musculoskeletal: No suspicious bone lesions identified. Sagittal images of the spine shows alignment and disc spaces to be preserved. Bilateral hip joints are unremarkable. IMPRESSION: 1. No evidence of pulmonary mass or adenopathy. 2. Acute infiltrate or pulmonary edema. 3. No acute inflammatory process within abdomen or pelvis. Moderate stool noted throughout the colon. No obstructing or constricting colonic mass. 4. No small bowel obstruction. 5. Normal appendix.  No pericecal inflammation. 6. No hydronephrosis or hydroureter. 7. Unremarkable prostate gland. 8. No ascites or free air.  No abdominal or pelvic adenopathy. Electronically Signed   By: Lahoma Crocker M.D.   On: 12/11/2015 13:45     Labs:   Basic Metabolic Panel:  Recent Labs Lab 12/10/15 0220 12/10/15 0408 12/11/15 0642  NA 138 143 140  K 4.6 3.4* 3.9  CL 105 103 107  CO2 23  --  25  GLUCOSE 119* 111* 139*  BUN 17 16 9   CREATININE 1.02 1.00 0.75  CALCIUM 9.0  --  9.2   GFR Estimated Creatinine Clearance: 141.3 mL/min (by C-G formula based on SCr of 0.8 mg/dL). Liver Function Tests:  Recent Labs Lab 12/11/15 0642  AST 36  ALT 28  ALKPHOS 44  BILITOT 0.9  PROT 7.0  ALBUMIN 3.5   No  results for input(s): LIPASE, AMYLASE in the last 168 hours. No results for input(s): AMMONIA in the last 168 hours. Coagulation profile  Recent Labs Lab 12/10/15 0543 12/10/15 1908 12/11/15 0642  INR >10.00* 1.05 1.04    CBC:  Recent Labs Lab 12/10/15 0220 12/10/15 0408 12/11/15 0642  WBC 9.2  --  16.3*  NEUTROABS 3.8  --   --   HGB 14.0 14.3 13.9  HCT 42.3 42.0 42.6  MCV 85.1  --  86.9  PLT 183  --  378   Cardiac Enzymes: No results for input(s): CKTOTAL, CKMB, CKMBINDEX, TROPONINI in the last 168 hours. BNP: Invalid input(s): POCBNP CBG:  Recent Labs Lab 12/10/15 0225 12/10/15 0747 12/11/15 0807 12/12/15 0607  GLUCAP 119* 113* 143* 126*   D-Dimer No results for input(s): DDIMER in the last 72 hours. Hgb A1c No results for input(s): HGBA1C in the last 72 hours. Lipid Profile No results for input(s): CHOL, HDL, LDLCALC, TRIG, CHOLHDL, LDLDIRECT in the last 72 hours. Thyroid function studies No results for input(s): TSH, T4TOTAL, T3FREE, THYROIDAB in the last 72 hours.  Invalid input(s): FREET3 Anemia work up No results for input(s): VITAMINB12, FOLATE, FERRITIN, TIBC, IRON, RETICCTPCT in the last 72 hours. Microbiology No results found for this or any previous visit (from the past 240 hour(s)).   Discharge Instructions:   Discharge Instructions    Diet general    Complete by:  As directed   Discharge instructions    Complete by:  As directed   No driving, no working No physical activity   Increase activity slowly    Complete by:  As directed  Medication List    TAKE these medications   dexamethasone 4 MG tablet Commonly known as:  DECADRON Take 1 tablet (4 mg total) by mouth every 12 (twelve) hours.   levETIRAcetam 500 MG tablet Commonly known as:  KEPPRA Take 1 tablet (500 mg total) by mouth 2 (two) times daily.      Follow-up Information    Elaina Hoops, MD .   Specialty:  Neurosurgery Why:  for surgery on Thursday Contact  information: [address] [place] [address] [phone]            Time coordinating discharge: 25 min  Signed:  Geradine Girt   [facility] 12/12/2015, 12:47 PM

## 2015-12-12 NOTE — Progress Notes (Signed)
Patient ID: Timothy Murillo, male   DOB: [birth year], 40 y.o.   MRN: [id number] Patient doing well no complaints this morning  Awake alert neurologically nonfocal  40 year old gentleman with a left parietal occipital mass within significant vasogenic edema. Workup has been negative so far 4 metastatic disease MRI scan of his brain is consistent with either a dural based met more likely possibly primary glioma less likely meningioma. Because of the diagnosis favoring a more malignant process we have recommended preoperative stereotactic radiosurgery and subsequent craniotomy for resection. We attentively set this up for Wednesday for the radiosurgery Thursday for the craniotomy. 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

## 2015-12-12 NOTE — Progress Notes (Signed)
Pt discharging at this time taking all personal belongings. IV discontinued, dry dressing applied. Discharge instructions provided along with prescriptions with verbal understanding. Pt denies pain or discomfort. No noted distress.

## 2015-12-13 ENCOUNTER — Encounter (HOSPITAL_COMMUNITY): Payer: Self-pay | Admitting: Radiation Oncology

## 2015-12-13 ENCOUNTER — Other Ambulatory Visit (HOSPITAL_COMMUNITY): Payer: Self-pay | Admitting: Neurosurgery

## 2015-12-13 DIAGNOSIS — D496 Neoplasm of unspecified behavior of brain: Secondary | ICD-10-CM

## 2015-12-16 ENCOUNTER — Ambulatory Visit: Payer: PRIVATE HEALTH INSURANCE | Admitting: Radiation Oncology

## 2015-12-18 ENCOUNTER — Encounter: Payer: Self-pay | Admitting: Radiation Oncology

## 2015-12-18 ENCOUNTER — Ambulatory Visit (HOSPITAL_COMMUNITY)
Admission: RE | Admit: 2015-12-18 | Discharge: 2015-12-18 | Disposition: A | Discharge status: Home / Self Care | Payer: PRIVATE HEALTH INSURANCE | Source: Ambulatory Visit | Attending: Neurosurgery | Admitting: Neurosurgery

## 2015-12-18 ENCOUNTER — Encounter (HOSPITAL_COMMUNITY)
Admission: RE | Admit: 2015-12-18 | Discharge: 2015-12-18 | Disposition: A | Discharge status: Home / Self Care | Payer: PRIVATE HEALTH INSURANCE | Source: Ambulatory Visit | Attending: Neurosurgery | Admitting: Neurosurgery

## 2015-12-18 ENCOUNTER — Encounter (HOSPITAL_COMMUNITY): Payer: Self-pay

## 2015-12-18 DIAGNOSIS — D496 Neoplasm of unspecified behavior of brain: Secondary | ICD-10-CM | POA: Insufficient documentation

## 2015-12-18 DIAGNOSIS — G936 Cerebral edema: Secondary | ICD-10-CM

## 2015-12-18 HISTORY — DX: Neoplasm of unspecified behavior of brain: D49.6

## 2015-12-18 HISTORY — DX: Unspecified asthma, uncomplicated: J45.909

## 2015-12-18 HISTORY — DX: Personal history of urinary calculi: Z87.442

## 2015-12-18 LAB — BASIC METABOLIC PANEL
ANION GAP: 12 (ref 5–15)
BUN: 15 mg/dL (ref 6–20)
CHLORIDE: 101 mmol/L (ref 101–111)
CO2: 26 mmol/L (ref 22–32)
Calcium: 9.5 mg/dL (ref 8.9–10.3)
Creatinine, Ser: 0.81 mg/dL (ref 0.61–1.24)
GFR calc Af Amer: >60 mL/min (ref >60)
GFR calc non Af Amer: >60 mL/min (ref >60)
GLUCOSE: 145 mg/dL — AB (ref 65–99)
POTASSIUM: 3.8 mmol/L (ref 3.5–5.1)
SODIUM: 139 mmol/L (ref 135–145)

## 2015-12-18 LAB — ABO/RH: ABO/RH(D): O POS

## 2015-12-18 LAB — CBC
HEMATOCRIT: 44.5 % (ref 39.0–52.0)
HEMOGLOBIN: 14.6 g/dL (ref 13.0–17.0)
MCH: 28.6 pg (ref 26.0–34.0)
MCHC: 32.8 g/dL (ref 30.0–36.0)
MCV: 87.3 fL (ref 78.0–100.0)
Platelets: 482 10*3/uL — ABNORMAL HIGH (ref 150–400)
RBC: 5.1 MIL/uL (ref 4.22–5.81)
RDW: 13.9 % (ref 11.5–15.5)
WBC: 19.3 10*3/uL — AB (ref 4.0–10.5)

## 2015-12-18 LAB — TYPE AND SCREEN
ABO/RH(D): O POS
Antibody Screen: NEGATIVE

## 2015-12-18 MED ORDER — CHLORHEXIDINE GLUCONATE CLOTH 2 % EX PADS: 6.0000 | MEDICATED_PAD | Freq: Once | CUTANEOUS | Status: DC

## 2015-12-18 MED ORDER — GADOBENATE DIMEGLUMINE 529 MG/ML IV SOLN
18.0000 mL | Freq: Once | INTRAVENOUS | Status: AC | PRN
Start: 2015-12-18 — End: 2015-12-18
  Administered 2015-12-18: 18 mL via INTRAVENOUS

## 2015-12-18 MED ORDER — CEFAZOLIN SODIUM-DEXTROSE 2-4 GM/100ML-% IV SOLN
2.0000 g | INTRAVENOUS | Status: AC
  Administered 2015-12-19: 2 g via INTRAVENOUS
  Filled 2015-12-18: qty 100

## 2015-12-18 NOTE — Pre-Procedure Instructions (Signed)
Timothy Murillo  12/18/2015      [id number] - [place], [place] [id number] [place] [id number] Phone: [phone] Fax: [phone]  [place], [place] - [address] [place] [address] Phone: [phone] Fax: [phone]    Your procedure is scheduled on  Thurs, Aug 17 @ 12:00 PM  Report to [facility] Admitting at 9:00 AM  Call this number if you have problems the morning of surgery:  [phone]   Remember:  Do not eat food or drink liquids after midnight.  Take these medicines the morning of surgery with A SIP OF WATER Keppra(Levetiracetam) and Decadron(Dexamethosone)              NO Goody's,BC's,Aleve,Advil,Motrin,Ibuprofen,Fish Oil,or any Herbal Medications.    Do not wear jewelry.  Do not wear lotions, powders, or colognes.               Men may shave face and neck.  Do not bring valuables to the hospital.  [facility] is not responsible for any belongings or valuables.  Contacts, dentures or bridgework may not be worn into surgery.  Leave your suitcase in the car.  After surgery it may be brought to your room.  For patients admitted to the hospital, discharge time will be determined by your treatment team.  Patients discharged the day of surgery will not be allowed to drive home.    Special instructions:  [facility] - Preparing for Surgery  Before surgery, you can play an important role.  Because skin is not sterile, your skin needs to be as free of germs as possible.  You can reduce the number of germs on you skin by washing with CHG (chlorahexidine gluconate) soap before surgery.  CHG is an antiseptic cleaner which kills germs and bonds with the skin to continue killing germs even after washing.  Please DO NOT use if you have an allergy to CHG or antibacterial soaps.  If your skin becomes  reddened/irritated stop using the CHG and inform your nurse when you arrive at Short Stay.  Do not shave (including legs and underarms) for at least 48 hours prior to the first CHG shower.  You may shave your face.  Please follow these instructions carefully:   1.  Shower with CHG Soap the night before surgery and the                                morning of Surgery.  2.  If you choose to wash your hair, wash your hair first as usual with your       normal shampoo.  3.  After you shampoo, rinse your hair and body thoroughly to remove the                      Shampoo.  4.  Use CHG as you would any other liquid soap.  You can apply chg directly       to the skin and wash gently with scrungie or a clean washcloth.  5.  Apply the CHG Soap to your body ONLY FROM THE NECK DOWN.        Do not use on open wounds or open sores.  Avoid contact with your eyes,  ears, mouth and genitals (private parts).  Wash genitals (private parts)       with your normal soap.  6.  Wash thoroughly, paying special attention to the area where your surgery        will be performed.  7.  Thoroughly rinse your body with warm water from the neck down.  8.  DO NOT shower/wash with your normal soap after using and rinsing off       the CHG Soap.  9.  Pat yourself dry with a clean towel.            10.  Wear clean pajamas.            11.  Place clean sheets on your bed the night of your first shower and do not        sleep with pets.  Day of Surgery  Do not apply any lotions/deoderants the morning of surgery.  Please wear clean clothes to the hospital/surgery center.    Please read over the following fact sheets that you were given. Pain Booklet, Coughing and Deep Breathing and Surgical Site Infection Prevention

## 2015-12-18 NOTE — Progress Notes (Signed)
Spoke with Timothy Murillo and notified her of WBC of 19.3. She will let Dr.Cram know.

## 2015-12-18 NOTE — Progress Notes (Addendum)
Cardiologist denies  Medical Md denies  Echo denies  Stress test denies  Heart cath denies  EKG in epic from 12-10-15  CXR denies

## 2015-12-19 ENCOUNTER — Encounter (HOSPITAL_COMMUNITY)
Admission: RE | Disposition: A | Discharge status: Home / Self Care | Payer: Self-pay | Source: Ambulatory Visit | Attending: Neurosurgery

## 2015-12-19 ENCOUNTER — Inpatient Hospital Stay (HOSPITAL_COMMUNITY): Payer: PRIVATE HEALTH INSURANCE | Admitting: Anesthesiology

## 2015-12-19 ENCOUNTER — Inpatient Hospital Stay (HOSPITAL_COMMUNITY)
Admission: RE | Admit: 2015-12-19 | Discharge: 2015-12-22 | DRG: 025 | Disposition: A | Discharge status: Home / Self Care | Payer: PRIVATE HEALTH INSURANCE | Source: Ambulatory Visit | Attending: Neurosurgery | Admitting: Neurosurgery

## 2015-12-19 ENCOUNTER — Encounter (HOSPITAL_COMMUNITY): Payer: Self-pay | Admitting: Anesthesiology

## 2015-12-19 DIAGNOSIS — Z7982 Long term (current) use of aspirin: Secondary | ICD-10-CM | POA: Diagnosis not present

## 2015-12-19 DIAGNOSIS — G9389 Other specified disorders of brain: Secondary | ICD-10-CM | POA: Diagnosis present

## 2015-12-19 DIAGNOSIS — Z79899 Other long term (current) drug therapy: Secondary | ICD-10-CM

## 2015-12-19 DIAGNOSIS — G936 Cerebral edema: Secondary | ICD-10-CM | POA: Diagnosis present

## 2015-12-19 DIAGNOSIS — G939 Disorder of brain, unspecified: Principal | ICD-10-CM | POA: Diagnosis present

## 2015-12-19 DIAGNOSIS — R402142 Coma scale, eyes open, spontaneous, at arrival to emergency department: Secondary | ICD-10-CM | POA: Diagnosis present

## 2015-12-19 DIAGNOSIS — R569 Unspecified convulsions: Secondary | ICD-10-CM | POA: Diagnosis present

## 2015-12-19 DIAGNOSIS — R402252 Coma scale, best verbal response, oriented, at arrival to emergency department: Secondary | ICD-10-CM | POA: Diagnosis present

## 2015-12-19 DIAGNOSIS — R402362 Coma scale, best motor response, obeys commands, at arrival to emergency department: Secondary | ICD-10-CM | POA: Diagnosis present

## 2015-12-19 HISTORY — PX: CRANIOTOMY: SHX93

## 2015-12-19 HISTORY — PX: APPLICATION OF CRANIAL NAVIGATION: SHX6578

## 2015-12-19 LAB — MRSA PCR SCREENING: MRSA BY PCR: NEGATIVE

## 2015-12-19 SURGERY — CRANIOTOMY TUMOR EXCISION
Anesthesia: General

## 2015-12-19 MED ORDER — PROPOFOL 10 MG/ML IV BOLUS
INTRAVENOUS | Status: AC
  Filled 2015-12-19: qty 20

## 2015-12-19 MED ORDER — HYDROMORPHONE HCL 1 MG/ML IJ SOLN
INTRAMUSCULAR | Status: AC
  Administered 2015-12-19: 0.5 mg via INTRAVENOUS
  Filled 2015-12-19: qty 1

## 2015-12-19 MED ORDER — ARTIFICIAL TEARS OP OINT
TOPICAL_OINTMENT | OPHTHALMIC | Status: AC
  Filled 2015-12-19: qty 3.5

## 2015-12-19 MED ORDER — ROCURONIUM BROMIDE 10 MG/ML (PF) SYRINGE
PREFILLED_SYRINGE | INTRAVENOUS | Status: AC
  Filled 2015-12-19: qty 10

## 2015-12-19 MED ORDER — LABETALOL HCL 5 MG/ML IV SOLN
10.0000 mg | INTRAVENOUS | Status: DC | PRN
  Administered 2015-12-19: 5 mg via INTRAVENOUS

## 2015-12-19 MED ORDER — BACITRACIN 50000 UNITS IM SOLR
INTRAMUSCULAR | Status: DC | PRN
  Administered 2015-12-19: 14:00:00

## 2015-12-19 MED ORDER — SUGAMMADEX SODIUM 200 MG/2ML IV SOLN
INTRAVENOUS | Status: DC | PRN
  Administered 2015-12-19: 200 mg via INTRAVENOUS

## 2015-12-19 MED ORDER — ONDANSETRON HCL 4 MG/2ML IJ SOLN: 4.0000 mg | INTRAMUSCULAR | Status: DC | PRN

## 2015-12-19 MED ORDER — SODIUM CHLORIDE 0.9 % IV SOLN
INTRAVENOUS | Status: DC | PRN
  Administered 2015-12-19: 12:00:00 via INTRAVENOUS

## 2015-12-19 MED ORDER — HEMOSTATIC AGENTS (NO CHARGE) OPTIME
TOPICAL | Status: DC | PRN
  Administered 2015-12-19: 1 via TOPICAL

## 2015-12-19 MED ORDER — LIDOCAINE HCL (CARDIAC) 20 MG/ML IV SOLN
INTRAVENOUS | Status: DC | PRN
  Administered 2015-12-19: 80 mg via INTRAVENOUS

## 2015-12-19 MED ORDER — FAMOTIDINE IN NACL 20-0.9 MG/50ML-% IV SOLN
20.0000 mg | Freq: Two times a day (BID) | INTRAVENOUS | Status: DC
  Administered 2015-12-19: 20 mg via INTRAVENOUS
  Filled 2015-12-19 (×2): qty 50

## 2015-12-19 MED ORDER — MIDAZOLAM HCL 2 MG/2ML IJ SOLN
INTRAMUSCULAR | Status: AC
  Filled 2015-12-19: qty 2

## 2015-12-19 MED ORDER — HYDROMORPHONE HCL 1 MG/ML IJ SOLN
0.5000 mg | INTRAMUSCULAR | Status: DC | PRN
  Administered 2015-12-20 – 2015-12-21 (×5): 1 mg via INTRAVENOUS
  Filled 2015-12-19 (×6): qty 1

## 2015-12-19 MED ORDER — LEVETIRACETAM 500 MG/5ML IV SOLN
500.0000 mg | Freq: Two times a day (BID) | INTRAVENOUS | Status: DC
  Administered 2015-12-19 – 2015-12-20 (×2): 500 mg via INTRAVENOUS
  Filled 2015-12-19 (×3): qty 5

## 2015-12-19 MED ORDER — MANNITOL 25 % IV SOLN
INTRAVENOUS | Status: DC | PRN
  Administered 2015-12-19: 20 g via INTRAVENOUS

## 2015-12-19 MED ORDER — LIDOCAINE 2% (20 MG/ML) 5 ML SYRINGE
INTRAMUSCULAR | Status: AC
  Filled 2015-12-19: qty 5

## 2015-12-19 MED ORDER — HYDROMORPHONE HCL 1 MG/ML IJ SOLN
0.2500 mg | INTRAMUSCULAR | Status: DC | PRN
  Administered 2015-12-19 (×2): 0.5 mg via INTRAVENOUS

## 2015-12-19 MED ORDER — DEXAMETHASONE SODIUM PHOSPHATE 4 MG/ML IJ SOLN
4.0000 mg | Freq: Four times a day (QID) | INTRAMUSCULAR | Status: AC
  Administered 2015-12-20 – 2015-12-21 (×4): 4 mg via INTRAVENOUS
  Filled 2015-12-19 (×3): qty 1

## 2015-12-19 MED ORDER — SUGAMMADEX SODIUM 200 MG/2ML IV SOLN
INTRAVENOUS | Status: AC
  Filled 2015-12-19: qty 2

## 2015-12-19 MED ORDER — ESMOLOL HCL 100 MG/10ML IV SOLN
INTRAVENOUS | Status: DC | PRN
  Administered 2015-12-19 (×2): 50 mg via INTRAVENOUS

## 2015-12-19 MED ORDER — ARTIFICIAL TEARS OP OINT
TOPICAL_OINTMENT | OPHTHALMIC | Status: DC | PRN
  Administered 2015-12-19: 1 via OPHTHALMIC

## 2015-12-19 MED ORDER — ONDANSETRON HCL 4 MG PO TABS: 4.0000 mg | ORAL_TABLET | ORAL | Status: DC | PRN

## 2015-12-19 MED ORDER — CEFAZOLIN SODIUM-DEXTROSE 2-4 GM/100ML-% IV SOLN
2.0000 g | Freq: Three times a day (TID) | INTRAVENOUS | Status: AC
  Administered 2015-12-19 – 2015-12-20 (×2): 2 g via INTRAVENOUS
  Filled 2015-12-19 (×2): qty 100

## 2015-12-19 MED ORDER — DEXAMETHASONE SODIUM PHOSPHATE 4 MG/ML IJ SOLN
4.0000 mg | Freq: Three times a day (TID) | INTRAMUSCULAR | Status: DC
  Administered 2015-12-21 – 2015-12-22 (×3): 4 mg via INTRAVENOUS
  Filled 2015-12-19 (×4): qty 1

## 2015-12-19 MED ORDER — LIDOCAINE-EPINEPHRINE 1 %-1:100000 IJ SOLN
INTRAMUSCULAR | Status: DC | PRN
  Administered 2015-12-19: 10 mL

## 2015-12-19 MED ORDER — ROCURONIUM BROMIDE 100 MG/10ML IV SOLN
INTRAVENOUS | Status: DC | PRN
  Administered 2015-12-19: 20 mg via INTRAVENOUS
  Administered 2015-12-19: 50 mg via INTRAVENOUS
  Administered 2015-12-19: 5 mg via INTRAVENOUS
  Administered 2015-12-19 (×2): 20 mg via INTRAVENOUS

## 2015-12-19 MED ORDER — LACTATED RINGERS IV SOLN
INTRAVENOUS | Status: DC
  Administered 2015-12-19 (×2): via INTRAVENOUS

## 2015-12-19 MED ORDER — LABETALOL HCL 5 MG/ML IV SOLN
INTRAVENOUS | Status: AC
  Filled 2015-12-19: qty 4

## 2015-12-19 MED ORDER — BACITRACIN ZINC 500 UNIT/GM EX OINT
TOPICAL_OINTMENT | CUTANEOUS | Status: DC | PRN
  Administered 2015-12-19: 1 via TOPICAL

## 2015-12-19 MED ORDER — FENTANYL CITRATE (PF) 100 MCG/2ML IJ SOLN
INTRAMUSCULAR | Status: DC | PRN
  Administered 2015-12-19 (×2): 50 ug via INTRAVENOUS
  Administered 2015-12-19: 100 ug via INTRAVENOUS
  Administered 2015-12-19 (×4): 50 ug via INTRAVENOUS

## 2015-12-19 MED ORDER — LACTATED RINGERS IV SOLN: INTRAVENOUS | Status: DC | PRN

## 2015-12-19 MED ORDER — ONDANSETRON HCL 4 MG/2ML IJ SOLN
INTRAMUSCULAR | Status: AC
  Filled 2015-12-19: qty 2

## 2015-12-19 MED ORDER — HYDROCODONE-ACETAMINOPHEN 5-325 MG PO TABS
1.0000 | ORAL_TABLET | ORAL | Status: DC | PRN
  Administered 2015-12-19 – 2015-12-22 (×9): 1 via ORAL
  Filled 2015-12-19 (×9): qty 1

## 2015-12-19 MED ORDER — THROMBIN 20000 UNITS EX SOLR
CUTANEOUS | Status: DC | PRN
  Administered 2015-12-19 (×2): via TOPICAL

## 2015-12-19 MED ORDER — DEXAMETHASONE SODIUM PHOSPHATE 10 MG/ML IJ SOLN
INTRAMUSCULAR | Status: DC | PRN
  Administered 2015-12-19: 10 mg via INTRAVENOUS

## 2015-12-19 MED ORDER — LEVETIRACETAM 500 MG PO TABS: 500.0000 mg | ORAL_TABLET | Freq: Two times a day (BID) | ORAL | Status: DC

## 2015-12-19 MED ORDER — DEXAMETHASONE SODIUM PHOSPHATE 10 MG/ML IJ SOLN
6.0000 mg | Freq: Four times a day (QID) | INTRAMUSCULAR | Status: AC
  Administered 2015-12-19 – 2015-12-20 (×4): 6 mg via INTRAVENOUS
  Filled 2015-12-19 (×4): qty 1

## 2015-12-19 MED ORDER — PROMETHAZINE HCL 25 MG PO TABS: 12.5000 mg | ORAL_TABLET | ORAL | Status: DC | PRN

## 2015-12-19 MED ORDER — FENTANYL CITRATE (PF) 100 MCG/2ML IJ SOLN
INTRAMUSCULAR | Status: AC
  Filled 2015-12-19: qty 6

## 2015-12-19 MED ORDER — DEXAMETHASONE SODIUM PHOSPHATE 10 MG/ML IJ SOLN
INTRAMUSCULAR | Status: AC
  Filled 2015-12-19: qty 1

## 2015-12-19 MED ORDER — FENTANYL CITRATE (PF) 100 MCG/2ML IJ SOLN
INTRAMUSCULAR | Status: AC
  Filled 2015-12-19: qty 2

## 2015-12-19 MED ORDER — ONDANSETRON HCL 4 MG/2ML IJ SOLN
INTRAMUSCULAR | Status: DC | PRN
  Administered 2015-12-19: 4 mg via INTRAVENOUS

## 2015-12-19 MED ORDER — PROPOFOL 10 MG/ML IV BOLUS
INTRAVENOUS | Status: DC | PRN
  Administered 2015-12-19: 50 mg via INTRAVENOUS
  Administered 2015-12-19: 200 mg via INTRAVENOUS
  Administered 2015-12-19 (×2): 20 mg via INTRAVENOUS

## 2015-12-19 MED ORDER — POTASSIUM CHLORIDE IN NACL 20-0.45 MEQ/L-% IV SOLN
INTRAVENOUS | Status: DC
Start: 2015-12-19 — End: 2015-12-22
  Administered 2015-12-19: 17:00:00 via INTRAVENOUS
  Administered 2015-12-20: 75 mL/h via INTRAVENOUS
  Administered 2015-12-20: via INTRAVENOUS
  Filled 2015-12-19 (×3): qty 1000

## 2015-12-19 MED ORDER — 0.9 % SODIUM CHLORIDE (POUR BTL) OPTIME
TOPICAL | Status: DC | PRN
  Administered 2015-12-19: 1000 mL

## 2015-12-19 SURGICAL SUPPLY — 94 items
BAG DECANTER FOR FLEXI CONT (MISCELLANEOUS) ×4 IMPLANT
BANDAGE GAUZE 4  KLING STR (GAUZE/BANDAGES/DRESSINGS) ×4 IMPLANT
BATTERY IQ STERILE (MISCELLANEOUS) ×4 IMPLANT
BLADE CLIPPER SURG (BLADE) ×4 IMPLANT
BLADE SURG 11 STRL SS (BLADE) ×4 IMPLANT
BNDG COHESIVE 4X5 TAN NS LF (GAUZE/BANDAGES/DRESSINGS) ×4 IMPLANT
BNDG GAUZE ELAST 4 BULKY (GAUZE/BANDAGES/DRESSINGS) ×4 IMPLANT
BRUSH SCRUB EZ 1% IODOPHOR (MISCELLANEOUS) ×4 IMPLANT
BRUSH SCRUB EZ PLAIN DRY (MISCELLANEOUS) ×4 IMPLANT
BTRY SRG DRVR 1.5 IQ (MISCELLANEOUS) ×2
BUR ACORN 9.0 PRECISION (BURR) ×3 IMPLANT
BUR ACORN 9.0MM PRECISION (BURR) ×1
BUR SPIRAL ROUTER 2.3 (BUR) IMPLANT
BUR SPIRAL ROUTER 2.3MM (BUR)
CANISTER SUCT 3000ML PPV (MISCELLANEOUS) ×8 IMPLANT
CLIP TI MEDIUM 6 (CLIP) ×4 IMPLANT
CONT SPEC 4OZ CLIKSEAL STRL BL (MISCELLANEOUS) ×4 IMPLANT
COVER BACK TABLE 60X90IN (DRAPES) ×4 IMPLANT
DECANTER SPIKE VIAL GLASS SM (MISCELLANEOUS) ×4 IMPLANT
DRAPE CAMERA VIDEO/LASER (DRAPES) IMPLANT
DRAPE LONG LASER MIC (DRAPES) IMPLANT
DRAPE MICROSCOPE LEICA (MISCELLANEOUS) IMPLANT
DRAPE STERI IOBAN 125X83 (DRAPES) IMPLANT
DRAPE WARM FLUID 44X44 (DRAPE) ×4 IMPLANT
DRSG OPSITE POSTOP 4X6 (GAUZE/BANDAGES/DRESSINGS) ×3 IMPLANT
ELECT CAUTERY BLADE 6.4 (BLADE) ×4 IMPLANT
ELECT REM PT RETURN 9FT ADLT (ELECTROSURGICAL) ×4
ELECTRODE REM PT RTRN 9FT ADLT (ELECTROSURGICAL) ×2 IMPLANT
FORCEPS BIPOLAR SPETZLER 8 1.0 (NEUROSURGERY SUPPLIES) ×4 IMPLANT
GAUZE SPONGE 4X4 12PLY STRL (GAUZE/BANDAGES/DRESSINGS) ×4 IMPLANT
GAUZE SPONGE 4X4 16PLY XRAY LF (GAUZE/BANDAGES/DRESSINGS) IMPLANT
GLOVE BIO SURGEON STRL SZ 6.5 (GLOVE) IMPLANT
GLOVE BIO SURGEON STRL SZ7 (GLOVE) IMPLANT
GLOVE BIO SURGEON STRL SZ7.5 (GLOVE) IMPLANT
GLOVE BIO SURGEON STRL SZ8 (GLOVE) ×4 IMPLANT
GLOVE BIO SURGEON STRL SZ8.5 (GLOVE) IMPLANT
GLOVE BIO SURGEONS STRL SZ 6.5 (GLOVE)
GLOVE BIOGEL M 8.0 STRL (GLOVE) IMPLANT
GLOVE ECLIPSE 6.5 STRL STRAW (GLOVE) IMPLANT
GLOVE ECLIPSE 7.0 STRL STRAW (GLOVE) ×4 IMPLANT
GLOVE ECLIPSE 7.5 STRL STRAW (GLOVE) IMPLANT
GLOVE ECLIPSE 8.0 STRL XLNG CF (GLOVE) IMPLANT
GLOVE ECLIPSE 8.5 STRL (GLOVE) IMPLANT
GLOVE EXAM NITRILE LRG STRL (GLOVE) IMPLANT
GLOVE EXAM NITRILE MD LF STRL (GLOVE) IMPLANT
GLOVE EXAM NITRILE XL STR (GLOVE) IMPLANT
GLOVE EXAM NITRILE XS STR PU (GLOVE) IMPLANT
GLOVE INDICATOR 6.5 STRL GRN (GLOVE) IMPLANT
GLOVE INDICATOR 7.0 STRL GRN (GLOVE) IMPLANT
GLOVE INDICATOR 7.5 STRL GRN (GLOVE) IMPLANT
GLOVE INDICATOR 8.0 STRL GRN (GLOVE) IMPLANT
GLOVE INDICATOR 8.5 STRL (GLOVE) ×4 IMPLANT
GLOVE OPTIFIT SS 8.0 STRL (GLOVE) IMPLANT
GLOVE SURG SS PI 6.5 STRL IVOR (GLOVE) IMPLANT
GOWN STRL REUS W/ TWL LRG LVL3 (GOWN DISPOSABLE) ×2 IMPLANT
GOWN STRL REUS W/ TWL XL LVL3 (GOWN DISPOSABLE) ×2 IMPLANT
GOWN STRL REUS W/TWL 2XL LVL3 (GOWN DISPOSABLE) ×4 IMPLANT
GOWN STRL REUS W/TWL LRG LVL3 (GOWN DISPOSABLE) ×4
GOWN STRL REUS W/TWL XL LVL3 (GOWN DISPOSABLE) ×4
HEMOSTAT SURGICEL 2X14 (HEMOSTASIS) IMPLANT
KIT BASIN OR (CUSTOM PROCEDURE TRAY) ×4 IMPLANT
KIT ROOM TURNOVER OR (KITS) ×4 IMPLANT
LIQUID BAND (GAUZE/BANDAGES/DRESSINGS) ×4 IMPLANT
MARKER SPHERE PSV REFLC 13MM (MARKER) ×8 IMPLANT
NEEDLE HYPO 25X1 1.5 SAFETY (NEEDLE) ×4 IMPLANT
NS IRRIG 1000ML POUR BTL (IV SOLUTION) ×8 IMPLANT
PACK CRANIOTOMY (CUSTOM PROCEDURE TRAY) ×4 IMPLANT
PAD ARMBOARD 7.5X6 YLW CONV (MISCELLANEOUS) ×12 IMPLANT
PAD EYE OVAL STERILE LF (GAUZE/BANDAGES/DRESSINGS) IMPLANT
PATTIES SURGICAL .25X.25 (GAUZE/BANDAGES/DRESSINGS) IMPLANT
PATTIES SURGICAL .5 X.5 (GAUZE/BANDAGES/DRESSINGS) IMPLANT
PATTIES SURGICAL .5 X3 (DISPOSABLE) IMPLANT
PATTIES SURGICAL 1X1 (DISPOSABLE) IMPLANT
PLATE 1.5  2HOLE LNG NEURO (Plate) ×4 IMPLANT
PLATE 1.5 2HOLE LNG NEURO (Plate) ×4 IMPLANT
PLATE 1.5/0.5 18.5MM BURR HOLE (Plate) ×4 IMPLANT
RUBBERBAND STERILE (MISCELLANEOUS) IMPLANT
SCREW SELF DRILL HT 1.5/4MM (Screw) ×4 IMPLANT
SPONGE NEURO XRAY DETECT 1X3 (DISPOSABLE) IMPLANT
SPONGE SURGIFOAM ABS GEL 100 (HEMOSTASIS) ×8 IMPLANT
SPONGE SURGIFOAM ABS GEL 12-7 (HEMOSTASIS) IMPLANT
STAPLER VISISTAT 35W (STAPLE) ×4 IMPLANT
SUT ETHILON 3 0 FSL (SUTURE) ×4 IMPLANT
SUT NURALON 4 0 TR CR/8 (SUTURE) ×12 IMPLANT
SUT VIC AB 2-0 CT1 18 (SUTURE) ×4 IMPLANT
SYR CONTROL 10ML LL (SYRINGE) ×4 IMPLANT
TIP SONASTAR PREC MISONIX 1.1 (TRAY / TRAY PROCEDURE) IMPLANT
TOWEL OR 17X24 6PK STRL BLUE (TOWEL DISPOSABLE) IMPLANT
TOWEL OR 17X26 10 PK STRL BLUE (TOWEL DISPOSABLE) ×4 IMPLANT
TRAY FOLEY W/METER SILVER 16FR (SET/KITS/TRAYS/PACK) ×4 IMPLANT
TUBE CONNECTING 12'X1/4 (SUCTIONS) ×1
TUBE CONNECTING 12X1/4 (SUCTIONS) ×3 IMPLANT
UNDERPAD 30X30 INCONTINENT (UNDERPADS AND DIAPERS) ×4 IMPLANT
WATER STERILE IRR 1000ML POUR (IV SOLUTION) ×4 IMPLANT

## 2015-12-19 NOTE — Progress Notes (Signed)
[facility] Progress Note Patient Name: Timothy Murillo DOB: [birth year] MRN: [id number]   Date of Service  12/19/2015  HPI/Events of Note  New pt eval S/p resection of lt parietal mass. Pt extubated, stable on camera check  eICU Interventions  No eICU interventions PCCM will be available as needed.      Intervention Category Evaluation Type: New Patient Evaluation  Akshith Moncus 12/19/2015, 5:05 PM

## 2015-12-19 NOTE — H&P (Signed)
Timothy Murillo is an 40 y.o. male.   Chief Complaint: Seizures HPI: Patient is a 40 year old woman presented with a seizure underwent workup CT scan MRI scan which showed an enhancing lesion left parietal that appeared to be durally based for metastatic workup has been negative this has the appearance of a dural based met however cannot rule out infection versus meningioma although it wasn't imaged ligament meningioma. Patient improved on steroids and is essentially asymptomatic.  Past Medical History:  Diagnosis Date  . Asthma    as a child  . Brain tumor (HCC)   . History of kidney stones   . Seizures (HCC)    last seizure 12/09/15.takes Keppra and Decadron daily    Past Surgical History:  Procedure Laterality Date  . ABDOMINAL SURGERY     hernia  . WISDOM TOOTH EXTRACTION      Family History  Problem Relation Age of Onset  . Hypertension Mother   . Hypertension Father   . Brain cancer Maternal Uncle    Social History:  reports that he has never smoked. He has never used smokeless tobacco. He reports that he drinks alcohol. He reports that he does not use drugs.  Allergies:  Allergies  Allergen Reactions  . No Known Allergies     Medications Prior to Admission  Medication Sig Dispense Refill  . dexamethasone (DECADRON) 4 MG tablet Take 1 tablet (4 mg total) by mouth every 12 (twelve) hours. 60 tablet 0  . levETIRAcetam (KEPPRA) 500 MG tablet Take 1 tablet (500 mg total) by mouth 2 (two) times daily. 60 tablet 0    Results for orders placed or performed during the hospital encounter of 12/18/15 (from the past 48 hour(s))  Basic metabolic panel     Status: Abnormal   Collection Time: 12/18/15  2:32 PM  Result Value Ref Range   Sodium 139 135 - 145 mmol/L   Potassium 3.8 3.5 - 5.1 mmol/L   Chloride 101 101 - 111 mmol/L   CO2 26 22 - 32 mmol/L   Glucose, Bld 145 (H) 65 - 99 mg/dL   BUN 15 6 - 20 mg/dL   Creatinine, Ser 7.29 0.61 - 1.24 mg/dL   Calcium 9.5 8.9 - 15.7  mg/dL   GFR calc non Af Amer >60 >60 mL/min   GFR calc Af Amer >60 >60 mL/min    Comment: (NOTE) The eGFR has been calculated using the CKD EPI equation. This calculation has not been validated in all clinical situations. eGFR's persistently <60 mL/min signify possible Chronic Kidney Disease.    Anion gap 12 5 - 15  CBC     Status: Abnormal   Collection Time: 12/18/15  2:32 PM  Result Value Ref Range   WBC 19.3 (H) 4.0 - 10.5 K/uL   RBC 5.10 4.22 - 5.81 MIL/uL   Hemoglobin 14.6 13.0 - 17.0 g/dL   HCT 78.5 57.6 - 13.0 %   MCV 87.3 78.0 - 100.0 fL   MCH 28.6 26.0 - 34.0 pg   MCHC 32.8 30.0 - 36.0 g/dL   RDW 93.8 68.9 - 20.8 %   Platelets 482 (H) 150 - 400 K/uL  Type and screen     Status: None   Collection Time: 12/18/15  2:40 PM  Result Value Ref Range   ABO/RH(D) O POS    Antibody Screen NEG    Sample Expiration 01/01/2016    Extend sample reason NO TRANSFUSIONS OR PREGNANCY IN THE PAST 3 MONTHS  ABO/Rh     Status: None   Collection Time: 12/18/15  2:40 PM  Result Value Ref Range   ABO/RH(D) Timothy Murillo POS    Mr Brain W Wo Contrast  Result Date: 12/18/2015 CLINICAL DATA:  Brain tumor. Solitary left parietal mass on recent routine brain MRI. Brain lab protocol for surgical planning. EXAM: MRI HEAD WITHOUT AND WITH CONTRAST TECHNIQUE: Multiplanar, multiecho pulse sequences of the brain and surrounding structures were obtained without and with intravenous contrast. CONTRAST:  76m MULTIHANCE GADOBENATE DIMEGLUMINE 529 MG/ML IV SOLN COMPARISON:  12/10/2015 FINDINGS: There is no evidence of acute infarct, intracranial hemorrhage, midline shift, or extra-axial fluid collection. Ventricles and sulci are unchanged in size and at most slightly prominent for age. Vasogenic edema in the left parietal lobe is unchanged. Avidly enhancing 16 x 12 x 16 mm inferior left parietal mass is unchanged from the recent routine brain MRI. Its precise location is again difficult to establish, however there  appears to be a broad interface with the dura and the lesion may be extra-axial as previously suggested. No additional enhancing brain lesions are identified. Orbits are unremarkable. There is mild left maxillary sinus mucosal thickening. The mastoid air cells are clear. Major intracranial vascular flow voids are preserved. No osseous lesion is identified. IMPRESSION: Unchanged enhancing left parietal lobe mass and the vasogenic edema as previously described. No additional lesions. Electronically Signed   By: ALogan BoresM.D.   On: 12/18/2015 11:14    Review of Systems  Constitutional: Negative.   Eyes: Negative.   Respiratory: Negative.   Cardiovascular: Negative.   Gastrointestinal: Negative.   Genitourinary: Negative.   Musculoskeletal: Negative.   Skin: Negative.   Neurological: Positive for headaches.  Endo/Heme/Allergies: Negative.   Psychiatric/Behavioral: Negative.     Blood pressure (!) 157/83, pulse 72, temperature 98.3 F (36.8 C), temperature source Oral, resp. rate 18, height '6\' 2"'$  (1.88 m), weight 88.1 kg (194 lb 3.2 oz), SpO2 100 %. Physical Exam  Constitutional: He is oriented to person, place, and time. He appears well-developed and well-nourished.  HENT:  Head: Normocephalic.  Eyes: Pupils are equal, round, and reactive to light.  Neck: Normal range of motion.  Respiratory: Effort normal.  GI: Soft. Bowel sounds are normal.  Neurological: He is alert and oriented to person, place, and time. He has normal strength. GCS eye subscore is 4. GCS verbal subscore is 5. GCS motor subscore is 6.  Patient is awake alert pupils equal extraocular movement is intact strength is 5 out of 5  Skin: Skin is warm and dry.     Assessment/Plan 40year old gentleman with a enhancing lesion left parietal presents for stereotactic craniotomy for resection. I've extensively gone over the risks and benefits of the operation with the patient as well as perioperative course expectations of  outcome and alternatives surgery he understands and agrees to proceed forward.  Timothy Penson P, MD 12/19/2015, 11:40 AM

## 2015-12-19 NOTE — Anesthesia Procedure Notes (Signed)
Procedure Name: Intubation Date/Time: 12/19/2015 12:38 PM Performed by: Suzy Bouchard Pre-anesthesia Checklist: Patient identified, Emergency Drugs available, Suction available and Patient being monitored Patient Re-evaluated:Patient Re-evaluated prior to inductionOxygen Delivery Method: Circle system utilized Preoxygenation: Pre-oxygenation with 100% oxygen Intubation Type: IV induction Ventilation: Oral airway inserted - appropriate to patient size Laryngoscope Size: Miller and 3 Grade View: Grade I Tube type: Oral Tube size: 7.5 mm Number of attempts: 1 Airway Equipment and Method: Stylet and Oral airway Placement Confirmation: ETT inserted through vocal cords under direct vision,  positive ETCO2 and breath sounds checked- equal and bilateral Secured at: 22 cm Tube secured with: Tape Dental Injury: Teeth and Oropharynx as per pre-operative assessment  Comments: Attempt x1 w/ Miller 2 w/ grade 2b view. Change to [facility] 3 w/ grade 1 view--pt intubated successfully

## 2015-12-19 NOTE — Transfer of Care (Signed)
Immediate Anesthesia Transfer of Care Note  Patient: Timothy Murillo  Procedure(s) Performed: Procedure(s) with comments: Left Parietal craniotomy for resection of tumor with brainlab (Left) - Left Parietal craniotomy for resection of tumor with brainlab APPLICATION OF CRANIAL NAVIGATION (N/A)  Patient Location: PACU  Anesthesia Type:General  Level of Consciousness: awake  Airway & Oxygen Therapy: Patient Spontanous Breathing and Patient connected to face mask oxygen  Post-op Assessment: Report given to RN and Patient moving all extremities X 4  Post vital signs: Reviewed and stable  Last Vitals:  Vitals:   12/19/15 0924  BP: (!) 157/83  Pulse: 72  Resp: 18  Temp: 36.8 C    Last Pain:  Vitals:   12/19/15 0924  TempSrc: Oral      Patients Stated Pain Goal: 4 ([id number])  Complications: No apparent anesthesia complications

## 2015-12-19 NOTE — Op Note (Signed)
Preoperative diagnosis: Left parietal mass  Postoperative diagnosis: Same  Procedure: Stereotactic craniotomy for resection of left parietal brain mass utilizing interoperative navigation through the BrainLab stereotactic system  Surgeon: Dominica Severin Robie Mcniel  Asst.: Jovita Gamma  Anesthesia: Gen.  EBL: Minimal  History of present illness: Patient is a very pleasant 40 year old gentleman who presented with a seizure workup revealed an enhancing mass that seemed to come the cortical surface possibly durally based workup for metastatic workup was negative the lesion did not have the imaging characteristics classic of meningioma but he did have an extensive amount of vasogenic edema. After adequate workup with achieved a she was counseled recommended left parietal craniotomy for resection extensively gone over the risks and benefits of the operation with the patient as well as perioperative course expectations of outcome and alternatives of surgery he understood and agreed to proceed forward.  Operative procedure: Patient was induced under general anesthesia positioned supine with a shoulder bump under his left shoulder his head turned the right and the left-sided his head was shaved prepped and draped in routine sterile fashion the BrainLab stereotactic navigation system was hooked up around a temporary reference to better was applied the lesion was identified after registration. Then incision was mapped out on centering over the lesion. Then after adequate prepping and draping a new reference number was placed confirmed localization of the lesion with the BrainLab stereotactic system infiltrate a linear incision behind the ear with 6 mL lidocaine with epi and this incision was made and clips were applied and a scalp the left parietal skull was dissected free flap was turned with single inferior burr hole the dura was noted be tense and the identified lesion again under the dura and open. Opened the dura. An  ellipse fashion with a pedicle posteriorly and immediately identified the beginnings of lesion which was bright cherry-red throughout with an arachnoid adhesive capsule to the neighboring cortical surface. This was not adherent to the dura at all the dura was easily reflected over the lesion and pulled away on its pedicle. At this point I began a sharply microdissector the arachnoid plane around the edges of the capsule and the cortical surface. Utilizing combination of a 3, 7 and a 10 rhoton dissector with microdissection and bipolar cautery and a light microscissors I developed a plane around the capsule this lesion and a 360 orientation. The neighboring cortical surface was easily distinguishable from the capsule of the tumor. After I came all around the tumor relieved it from its pedicle and sent it en bloc for both permanent and frozen section. I inspected the cortical surface there was no abnormal areas of tissue there was a little area on the inferior pedicle that look like the tumor might've infiltrated through the pial surface and this had the appearance of more edematous white matter but did not appear to be part of the tumor. Then this was copiously irrigated meticulous in space was maintained a piece of Surgicel was laid in the tumor bed the dura was then closed primarily piece of Gelfoam the cranial flap was then reapplied and the linear scalp incision was closed with after Vicryl and a running nylon. The wounds and dressed and patient went covered in stable condition. At the end the case all needle counts sponge counts were correct.

## 2015-12-19 NOTE — Anesthesia Preprocedure Evaluation (Signed)
Anesthesia Evaluation  Patient identified by MRN, date of birth, ID band Patient awake    Reviewed: Allergy & Precautions, NPO status , Patient's Chart, lab work & pertinent test results  Airway Mallampati: II  TM Distance: >3 FB Neck ROM: Full    Dental  (+) Dental Advisory Given   Pulmonary asthma ,    breath sounds clear to auscultation       Cardiovascular negative cardio ROS   Rhythm:Regular Rate:Normal     Neuro/Psych Seizures -,     GI/Hepatic negative GI ROS, Neg liver ROS,   Endo/Other  negative endocrine ROS  Renal/GU negative Renal ROS     Musculoskeletal   Abdominal   Peds  Hematology   Anesthesia Other Findings   Reproductive/Obstetrics                             Anesthesia Physical Anesthesia Plan  ASA: III  Anesthesia Plan: General   Post-op Pain Management:    Induction: Intravenous  Airway Management Planned: Oral ETT  Additional Equipment: Arterial line  Intra-op Plan:   Post-operative Plan: Possible Post-op intubation/ventilation  Informed Consent: I have reviewed the patients History and Physical, chart, labs and discussed the procedure including the risks, benefits and alternatives for the proposed anesthesia with the patient or authorized representative who has indicated his/her understanding and acceptance.   Dental advisory given  Plan Discussed with: CRNA and Anesthesiologist  Anesthesia Plan Comments:         Anesthesia Quick Evaluation

## 2015-12-19 NOTE — Anesthesia Postprocedure Evaluation (Signed)
Anesthesia Post Note  Patient: Destin Jonasson Brzoska  Procedure(s) Performed: Procedure(s) (LRB): Left Parietal craniotomy for resection of tumor with brainlab (Left) APPLICATION OF CRANIAL NAVIGATION (N/A)  Patient location during evaluation: PACU Anesthesia Type: General Level of consciousness: awake and oriented Pain management: pain level controlled Vital Signs Assessment: post-procedure vital signs reviewed and stable Respiratory status: spontaneous breathing, nonlabored ventilation and respiratory function stable Cardiovascular status: blood pressure returned to baseline Anesthetic complications: no    Last Vitals:  Vitals:   12/19/15 1624 12/19/15 1700  BP: 131/89 125/77  Pulse: (!) 128 62  Resp: 20 16  Temp: 36.8 C     Last Pain:  Vitals:   12/19/15 1624  TempSrc: Oral  PainSc: 4                  Isra Lindy COKER

## 2015-12-20 ENCOUNTER — Inpatient Hospital Stay (HOSPITAL_COMMUNITY): Payer: PRIVATE HEALTH INSURANCE

## 2015-12-20 ENCOUNTER — Encounter (HOSPITAL_COMMUNITY): Payer: Self-pay | Admitting: Neurosurgery

## 2015-12-20 MED ORDER — FAMOTIDINE 20 MG PO TABS
20.0000 mg | ORAL_TABLET | Freq: Two times a day (BID) | ORAL | Status: DC
  Administered 2015-12-20 – 2015-12-21 (×4): 20 mg via ORAL
  Filled 2015-12-20 (×4): qty 1

## 2015-12-20 MED ORDER — GADOBENATE DIMEGLUMINE 529 MG/ML IV SOLN
20.0000 mL | Freq: Once | INTRAVENOUS | Status: AC
  Administered 2015-12-20: 19 mL via INTRAVENOUS

## 2015-12-20 MED ORDER — LEVETIRACETAM 500 MG PO TABS
500.0000 mg | ORAL_TABLET | Freq: Two times a day (BID) | ORAL | Status: DC
  Administered 2015-12-20 – 2015-12-21 (×3): 500 mg via ORAL
  Filled 2015-12-20 (×3): qty 1

## 2015-12-20 NOTE — Care Management Note (Signed)
Case Management Note  Patient Details  Name: Timothy Murillo MRN: [id number] Date of Birth: [birth year]  Subjective/Objective:   Pt admitted on 12/19/15 s/p craniotomy for brain tumor resection.  PTA, pt independent of ADLS; has supportive fiance and parents.                    Action/Plan: Will follow for discharge planning as pt progresses.    Expected Discharge Date:                  Expected Discharge Plan:  Home/Self Care  In-House Referral:     Discharge planning Services  CM Consult  Post Acute Care Choice:    Choice offered to:     DME Arranged:    DME Agency:     HH Arranged:    HH Agency:     Status of Service:  In process, will continue to follow  If discussed at Long Length of Stay Meetings, dates discussed:    Additional Comments:  Reinaldo Raddle, RN, BSN  Trauma/Neuro ICU Case Manager [phone]

## 2015-12-20 NOTE — Progress Notes (Signed)
Patient ID: Timothy Murillo, male   DOB: [birth year], 40 y.o.   MRN: [id number] Patient doing very well no significant headache  Strength out of 5 wound clean dry and intact  Mobilized today DC A-line DC Foley transfer the floor later on.

## 2015-12-21 NOTE — Progress Notes (Signed)
Postop day 2. Patient with minimal headache. No symptoms of numbness weakness or language difficulty. Pathology pending.  He is afebrile. Vitals are stable. Awake and alert. Oriented and appropriate. Motor and sensory intact. Wound clean and dry.  Postop scan looks good. No evidence of residual tumor.  Doing well following craniotomy for extra-axial tumor. Transfer to floor. Mobilize. Possible discharge tomorrow.

## 2015-12-22 MED ORDER — HYDROCODONE-ACETAMINOPHEN 5-325 MG PO TABS: 1.0000 | ORAL_TABLET | ORAL | 0 refills | Status: DC | PRN

## 2015-12-22 MED ORDER — METHYLPREDNISOLONE 4 MG PO TBPK: ORAL_TABLET | ORAL | 0 refills | Status: DC

## 2015-12-22 NOTE — Progress Notes (Signed)
Patient is being discharged home. Discharge instructions were given to patient 

## 2015-12-22 NOTE — Discharge Summary (Signed)
Physician Discharge Summary  Patient ID: Timothy Murillo MRN: [id number] DOB/AGE: Dec 31, 1975 40 y.o.  Admit date: 12/19/2015 Discharge date: 12/22/2015  Admission Diagnoses:  Discharge Diagnoses:  Active Problems:   Brain mass   Discharged Condition: good  Hospital Course: Patient admitted to the hospital for evaluation of an extra-axial brain mass. Patient when uncomplicated surgery. Pathology remains pending. Follow-up scan reveals gross total resection. Patient doing well. Mobilizing without difficulty. Ready for discharge home.  Consults:   Significant Diagnostic Studies:   Treatments:   Discharge Exam: Blood pressure 107/80, pulse (!) 58, temperature 98.4 F (36.9 C), temperature source Oral, resp. rate 18, height 6\' 2"  (1.88 m), weight 88.1 kg (194 lb 3.2 oz), SpO2 95 %. Awake and alert. Oriented and appropriate. Cranial nerve function intact. Motor sensory function extremities normal. Wound clean and dry. Chest and abdomen benign.  Disposition: 01-Home or Self Care     Medication List    STOP taking these medications   dexamethasone 4 MG tablet Commonly known as:  DECADRON     TAKE these medications   HYDROcodone-acetaminophen 5-325 MG tablet Commonly known as:  NORCO/VICODIN Take 1 tablet by mouth every 4 (four) hours as needed for moderate pain.   levETIRAcetam 500 MG tablet Commonly known as:  KEPPRA Take 1 tablet (500 mg total) by mouth 2 (two) times daily.   methylPREDNISolone 4 MG Tbpk tablet Commonly known as:  MEDROL DOSEPAK follow package directions      Follow-up Information    CRAM,GARY P, MD .   Specialty:  Neurosurgery Contact information: [address] [place] [address] [place] [address] [phone]           Signed: Charlie Pitter 12/22/2015, 7:35 AM

## 2015-12-24 NOTE — Addendum Note (Signed)
Addendum  created 12/24/15 1911 by Roberts Gaudy, MD   Anesthesia Staff edited

## 2016-01-14 ENCOUNTER — Encounter (HOSPITAL_COMMUNITY): Payer: Self-pay

## 2016-01-29 ENCOUNTER — Encounter: Payer: Self-pay | Admitting: Radiation Therapy

## 2016-01-29 NOTE — Progress Notes (Signed)
Dr. Saintclair Halsted referred pt to [facility], Dr. Eugenia Pancoast. No appointments needed for Rad Onc or follow-up imaging at this time.  Mont Dutton R.T.(R)(T) Special Procedures Navigator

## 2016-01-30 ENCOUNTER — Encounter: Payer: Self-pay | Admitting: Radiation Oncology

## 2016-01-30 NOTE — Progress Notes (Signed)
The patient's case was presented at yesterday's neuro oncology conference. Initially we were considering preop SRS, however the patient ultimately was taken to the OR for primary resection. 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 His postop scan revealed a small area of dural enhancement, and recommendations were made for repeat MRI in 2 months, as well as referral to neuro oncology. Dr. Saintclair Halsted has coordinated this referral.

## 2016-03-19 ENCOUNTER — Other Ambulatory Visit: Payer: Self-pay | Admitting: Neurosurgery

## 2016-03-20 NOTE — Progress Notes (Signed)
Several unsuccessful attempts were made to contact pt. Left voice message with pre-op instructions on pt phone. Please complete assessment on DOS. Pt made aware to stop taking vitamins, fish oil and herbal medications. Do not take any NSAIDs ie: Ibuprofen, Advil, Naproxen, BC and Goody Powder or any medication containing Aspirin.

## 2016-03-22 MED ORDER — CHLORHEXIDINE GLUCONATE CLOTH 2 % EX PADS: 6.0000 | MEDICATED_PAD | Freq: Once | CUTANEOUS | Status: DC

## 2016-03-22 MED ORDER — DEXAMETHASONE SODIUM PHOSPHATE 10 MG/ML IJ SOLN: 10.0000 mg | INTRAMUSCULAR | Status: DC

## 2016-03-22 MED ORDER — CEFAZOLIN SODIUM-DEXTROSE 2-4 GM/100ML-% IV SOLN
2.0000 g | INTRAVENOUS | Status: AC
  Administered 2016-03-23: 2 g via INTRAVENOUS

## 2016-03-23 ENCOUNTER — Inpatient Hospital Stay (HOSPITAL_COMMUNITY): Payer: PRIVATE HEALTH INSURANCE | Admitting: Anesthesiology

## 2016-03-23 ENCOUNTER — Encounter (HOSPITAL_COMMUNITY)
Admission: RE | Disposition: A | Discharge status: Home / Self Care | Payer: Self-pay | Source: Ambulatory Visit | Attending: Neurosurgery

## 2016-03-23 ENCOUNTER — Inpatient Hospital Stay (HOSPITAL_COMMUNITY)
Admission: RE | Admit: 2016-03-23 | Discharge: 2016-03-26 | DRG: 027 | Disposition: A | Discharge status: Home / Self Care | Payer: PRIVATE HEALTH INSURANCE | Source: Ambulatory Visit | Attending: Neurosurgery | Admitting: Neurosurgery

## 2016-03-23 ENCOUNTER — Encounter (HOSPITAL_COMMUNITY): Payer: Self-pay | Admitting: *Deleted

## 2016-03-23 DIAGNOSIS — Z8249 Family history of ischemic heart disease and other diseases of the circulatory system: Secondary | ICD-10-CM

## 2016-03-23 DIAGNOSIS — Z79891 Long term (current) use of opiate analgesic: Secondary | ICD-10-CM | POA: Diagnosis not present

## 2016-03-23 DIAGNOSIS — Z85841 Personal history of malignant neoplasm of brain: Secondary | ICD-10-CM

## 2016-03-23 DIAGNOSIS — Z79899 Other long term (current) drug therapy: Secondary | ICD-10-CM

## 2016-03-23 DIAGNOSIS — J45909 Unspecified asthma, uncomplicated: Secondary | ICD-10-CM | POA: Diagnosis present

## 2016-03-23 DIAGNOSIS — I6203 Nontraumatic chronic subdural hemorrhage: Principal | ICD-10-CM | POA: Diagnosis present

## 2016-03-23 DIAGNOSIS — I6200 Nontraumatic subdural hemorrhage, unspecified: Secondary | ICD-10-CM | POA: Diagnosis present

## 2016-03-23 DIAGNOSIS — S065X9A Traumatic subdural hemorrhage with loss of consciousness of unspecified duration, initial encounter: Secondary | ICD-10-CM

## 2016-03-23 DIAGNOSIS — S065XAA Traumatic subdural hemorrhage with loss of consciousness status unknown, initial encounter: Secondary | ICD-10-CM | POA: Diagnosis present

## 2016-03-23 HISTORY — PX: CRANIOTOMY: SHX93

## 2016-03-23 LAB — BASIC METABOLIC PANEL
ANION GAP: 8 (ref 5–15)
Anion gap: 9 (ref 5–15)
BUN: 10 mg/dL (ref 6–20)
BUN: 10 mg/dL (ref 6–20)
CALCIUM: 9.6 mg/dL (ref 8.9–10.3)
CALCIUM: 8.7 mg/dL — AB (ref 8.9–10.3)
CHLORIDE: 108 mmol/L (ref 101–111)
CO2: 26 mmol/L (ref 22–32)
CO2: 23 mmol/L (ref 22–32)
CREATININE: 0.89 mg/dL (ref 0.61–1.24)
CREATININE: 0.97 mg/dL (ref 0.61–1.24)
Chloride: 107 mmol/L (ref 101–111)
GFR calc Af Amer: >60 mL/min (ref >60)
Glucose, Bld: 86 mg/dL (ref 65–99)
Glucose, Bld: 155 mg/dL — ABNORMAL HIGH (ref 65–99)
Potassium: 3.8 mmol/L (ref 3.5–5.1)
Potassium: 4 mmol/L (ref 3.5–5.1)
SODIUM: 141 mmol/L (ref 135–145)
SODIUM: 140 mmol/L (ref 135–145)

## 2016-03-23 LAB — CBC
HCT: 42.5 % (ref 39.0–52.0)
HEMATOCRIT: 42.8 % (ref 39.0–52.0)
Hemoglobin: 14.2 g/dL (ref 13.0–17.0)
Hemoglobin: 13.7 g/dL (ref 13.0–17.0)
MCH: 28 pg (ref 26.0–34.0)
MCH: 27.6 pg (ref 26.0–34.0)
MCHC: 33.2 g/dL (ref 30.0–36.0)
MCHC: 32.2 g/dL (ref 30.0–36.0)
MCV: 84.3 fL (ref 78.0–100.0)
MCV: 85.5 fL (ref 78.0–100.0)
PLATELETS: 414 10*3/uL — AB (ref 150–400)
Platelets: 439 10*3/uL — ABNORMAL HIGH (ref 150–400)
RBC: 5.08 MIL/uL (ref 4.22–5.81)
RBC: 4.97 MIL/uL (ref 4.22–5.81)
RDW: 13.3 % (ref 11.5–15.5)
RDW: 13.4 % (ref 11.5–15.5)
WBC: 5.7 10*3/uL (ref 4.0–10.5)
WBC: 10.1 10*3/uL (ref 4.0–10.5)

## 2016-03-23 LAB — TYPE AND SCREEN
ABO/RH(D): O POS
ANTIBODY SCREEN: NEGATIVE

## 2016-03-23 SURGERY — CRANIOTOMY HEMATOMA EVACUATION SUBDURAL
Anesthesia: General | Site: Head | Laterality: Left

## 2016-03-23 MED ORDER — ONDANSETRON HCL 4 MG/2ML IJ SOLN
INTRAMUSCULAR | Status: DC | PRN
  Administered 2016-03-23: 4 mg via INTRAVENOUS

## 2016-03-23 MED ORDER — CEFAZOLIN SODIUM-DEXTROSE 2-4 GM/100ML-% IV SOLN
2.0000 g | Freq: Three times a day (TID) | INTRAVENOUS | Status: AC
  Administered 2016-03-23 – 2016-03-25 (×6): 2 g via INTRAVENOUS
  Filled 2016-03-23 (×7): qty 100

## 2016-03-23 MED ORDER — PROMETHAZINE HCL 25 MG/ML IJ SOLN: 6.2500 mg | INTRAMUSCULAR | Status: DC | PRN

## 2016-03-23 MED ORDER — SODIUM CHLORIDE 0.9 % IV SOLN
INTRAVENOUS | Status: DC | PRN
Start: 2016-03-23 — End: 2016-03-23
  Administered 2016-03-23: 16:00:00 via INTRAVENOUS

## 2016-03-23 MED ORDER — ONDANSETRON HCL 4 MG PO TABS: 4.0000 mg | ORAL_TABLET | ORAL | Status: DC | PRN

## 2016-03-23 MED ORDER — LEVETIRACETAM 500 MG/5ML IV SOLN
500.0000 mg | Freq: Two times a day (BID) | INTRAVENOUS | Status: DC
  Administered 2016-03-23 – 2016-03-24 (×3): 500 mg via INTRAVENOUS
  Filled 2016-03-23 (×5): qty 5

## 2016-03-23 MED ORDER — ONDANSETRON HCL 4 MG/2ML IJ SOLN
INTRAMUSCULAR | Status: AC
  Filled 2016-03-23: qty 4

## 2016-03-23 MED ORDER — ESMOLOL HCL 100 MG/10ML IV SOLN
INTRAVENOUS | Status: AC
  Filled 2016-03-23: qty 10

## 2016-03-23 MED ORDER — LIDOCAINE 2% (20 MG/ML) 5 ML SYRINGE
INTRAMUSCULAR | Status: AC
  Filled 2016-03-23: qty 5

## 2016-03-23 MED ORDER — KCL IN DEXTROSE-NACL 20-5-0.45 MEQ/L-%-% IV SOLN
INTRAVENOUS | Status: DC
  Administered 2016-03-23 – 2016-03-25 (×3): via INTRAVENOUS
  Filled 2016-03-23 (×6): qty 1000

## 2016-03-23 MED ORDER — HYDROCODONE-ACETAMINOPHEN 5-325 MG PO TABS
1.0000 | ORAL_TABLET | ORAL | Status: DC | PRN
  Administered 2016-03-23: 1 via ORAL
  Filled 2016-03-23: qty 1

## 2016-03-23 MED ORDER — MIDAZOLAM HCL 5 MG/5ML IJ SOLN
INTRAMUSCULAR | Status: DC | PRN
  Administered 2016-03-23: 1 mg via INTRAVENOUS

## 2016-03-23 MED ORDER — THROMBIN 20000 UNITS EX SOLR
CUTANEOUS | Status: AC
  Filled 2016-03-23: qty 20000

## 2016-03-23 MED ORDER — HYDROMORPHONE HCL 1 MG/ML IJ SOLN
0.5000 mg | INTRAMUSCULAR | Status: DC | PRN
  Administered 2016-03-24: 1 mg via INTRAVENOUS
  Filled 2016-03-23: qty 1

## 2016-03-23 MED ORDER — LACTATED RINGERS IV SOLN
INTRAVENOUS | Status: DC | PRN
  Administered 2016-03-23: 14:00:00 via INTRAVENOUS

## 2016-03-23 MED ORDER — DEXAMETHASONE SODIUM PHOSPHATE 10 MG/ML IJ SOLN
6.0000 mg | Freq: Four times a day (QID) | INTRAMUSCULAR | Status: AC
  Administered 2016-03-23 – 2016-03-24 (×4): 6 mg via INTRAVENOUS
  Filled 2016-03-23 (×4): qty 1

## 2016-03-23 MED ORDER — LABETALOL HCL 5 MG/ML IV SOLN: 10.0000 mg | INTRAVENOUS | Status: DC | PRN

## 2016-03-23 MED ORDER — FENTANYL CITRATE (PF) 100 MCG/2ML IJ SOLN
25.0000 ug | INTRAMUSCULAR | Status: DC | PRN
  Administered 2016-03-23 (×2): 50 ug via INTRAVENOUS

## 2016-03-23 MED ORDER — LIDOCAINE HCL (CARDIAC) 20 MG/ML IV SOLN
INTRAVENOUS | Status: DC | PRN
  Administered 2016-03-23: 100 mg via INTRAVENOUS

## 2016-03-23 MED ORDER — BACITRACIN ZINC 500 UNIT/GM EX OINT
TOPICAL_OINTMENT | CUTANEOUS | Status: AC
  Filled 2016-03-23: qty 28.35

## 2016-03-23 MED ORDER — HEMOSTATIC AGENTS (NO CHARGE) OPTIME
TOPICAL | Status: DC | PRN
  Administered 2016-03-23: 1 via TOPICAL

## 2016-03-23 MED ORDER — ESMOLOL HCL 100 MG/10ML IV SOLN
INTRAVENOUS | Status: DC | PRN
  Administered 2016-03-23: 10 mg via INTRAVENOUS
  Administered 2016-03-23 (×2): 20 mg via INTRAVENOUS
  Administered 2016-03-23: 10 mg via INTRAVENOUS

## 2016-03-23 MED ORDER — 0.9 % SODIUM CHLORIDE (POUR BTL) OPTIME
TOPICAL | Status: DC | PRN
  Administered 2016-03-23 (×3): 1000 mL

## 2016-03-23 MED ORDER — SUGAMMADEX SODIUM 200 MG/2ML IV SOLN
INTRAVENOUS | Status: AC
Start: 2016-03-23 — End: 2016-03-23
  Filled 2016-03-23: qty 2

## 2016-03-23 MED ORDER — FENTANYL CITRATE (PF) 100 MCG/2ML IJ SOLN
INTRAMUSCULAR | Status: AC
  Filled 2016-03-23: qty 2

## 2016-03-23 MED ORDER — LIDOCAINE-EPINEPHRINE (PF) 2 %-1:200000 IJ SOLN
INTRAMUSCULAR | Status: AC
  Filled 2016-03-23: qty 20

## 2016-03-23 MED ORDER — FENTANYL CITRATE (PF) 100 MCG/2ML IJ SOLN
INTRAMUSCULAR | Status: DC | PRN
  Administered 2016-03-23 (×4): 50 ug via INTRAVENOUS
  Administered 2016-03-23: 100 ug via INTRAVENOUS

## 2016-03-23 MED ORDER — ONDANSETRON HCL 4 MG/2ML IJ SOLN: 4.0000 mg | INTRAMUSCULAR | Status: DC | PRN

## 2016-03-23 MED ORDER — PROMETHAZINE HCL 25 MG PO TABS: 12.5000 mg | ORAL_TABLET | ORAL | Status: DC | PRN

## 2016-03-23 MED ORDER — FENTANYL CITRATE (PF) 100 MCG/2ML IJ SOLN
INTRAMUSCULAR | Status: AC
  Filled 2016-03-23: qty 4

## 2016-03-23 MED ORDER — DEXAMETHASONE SODIUM PHOSPHATE 4 MG/ML IJ SOLN
4.0000 mg | Freq: Three times a day (TID) | INTRAMUSCULAR | Status: DC
  Administered 2016-03-25 – 2016-03-26 (×2): 4 mg via INTRAVENOUS
  Filled 2016-03-23 (×2): qty 1

## 2016-03-23 MED ORDER — DEXAMETHASONE SODIUM PHOSPHATE 10 MG/ML IJ SOLN
INTRAMUSCULAR | Status: DC | PRN
  Administered 2016-03-23: 10 mg via INTRAVENOUS

## 2016-03-23 MED ORDER — THROMBIN 20000 UNITS EX SOLR
CUTANEOUS | Status: DC | PRN
  Administered 2016-03-23: 17:00:00 via TOPICAL

## 2016-03-23 MED ORDER — SODIUM CHLORIDE 0.9 % IV SOLN
INTRAVENOUS | Status: DC | PRN
  Administered 2016-03-23: 15:00:00 via INTRAVENOUS

## 2016-03-23 MED ORDER — CEFAZOLIN SODIUM 1 G IJ SOLR
INTRAMUSCULAR | Status: AC
  Filled 2016-03-23: qty 20

## 2016-03-23 MED ORDER — MIDAZOLAM HCL 2 MG/2ML IJ SOLN
INTRAMUSCULAR | Status: AC
  Filled 2016-03-23: qty 2

## 2016-03-23 MED ORDER — PHENYLEPHRINE HCL 10 MG/ML IJ SOLN
INTRAVENOUS | Status: DC | PRN
  Administered 2016-03-23: 10 ug/min via INTRAVENOUS

## 2016-03-23 MED ORDER — PROPOFOL 10 MG/ML IV BOLUS
INTRAVENOUS | Status: AC
  Filled 2016-03-23: qty 20

## 2016-03-23 MED ORDER — ROCURONIUM BROMIDE 10 MG/ML (PF) SYRINGE
PREFILLED_SYRINGE | INTRAVENOUS | Status: AC
  Filled 2016-03-23: qty 10

## 2016-03-23 MED ORDER — SUGAMMADEX SODIUM 200 MG/2ML IV SOLN
INTRAVENOUS | Status: DC | PRN
  Administered 2016-03-23: 100 mg via INTRAVENOUS

## 2016-03-23 MED ORDER — BACITRACIN 50000 UNITS IM SOLR
INTRAMUSCULAR | Status: DC | PRN
  Administered 2016-03-23: 17:00:00

## 2016-03-23 MED ORDER — PROPOFOL 10 MG/ML IV BOLUS
INTRAVENOUS | Status: DC | PRN
  Administered 2016-03-23: 150 mg via INTRAVENOUS

## 2016-03-23 MED ORDER — PANTOPRAZOLE SODIUM 40 MG IV SOLR
40.0000 mg | Freq: Every day | INTRAVENOUS | Status: DC
  Administered 2016-03-23 – 2016-03-24 (×2): 40 mg via INTRAVENOUS
  Filled 2016-03-23 (×2): qty 40

## 2016-03-23 MED ORDER — LIDOCAINE-EPINEPHRINE (PF) 2 %-1:200000 IJ SOLN
INTRAMUSCULAR | Status: DC | PRN
  Administered 2016-03-23: 10 mL

## 2016-03-23 MED ORDER — LEVETIRACETAM 500 MG PO TABS: 500.0000 mg | ORAL_TABLET | Freq: Two times a day (BID) | ORAL | Status: DC

## 2016-03-23 MED ORDER — ROCURONIUM BROMIDE 100 MG/10ML IV SOLN
INTRAVENOUS | Status: DC | PRN
  Administered 2016-03-23: 20 mg via INTRAVENOUS
  Administered 2016-03-23: 10 mg via INTRAVENOUS
  Administered 2016-03-23: 50 mg via INTRAVENOUS

## 2016-03-23 MED ORDER — DEXAMETHASONE SODIUM PHOSPHATE 10 MG/ML IJ SOLN
INTRAMUSCULAR | Status: AC
  Filled 2016-03-23: qty 2

## 2016-03-23 MED ORDER — DEXAMETHASONE SODIUM PHOSPHATE 4 MG/ML IJ SOLN
4.0000 mg | Freq: Four times a day (QID) | INTRAMUSCULAR | Status: AC
  Administered 2016-03-25 (×4): 4 mg via INTRAVENOUS
  Filled 2016-03-23 (×4): qty 1

## 2016-03-23 SURGICAL SUPPLY — 77 items
ADH SKN CLS APL DERMABOND .7 (GAUZE/BANDAGES/DRESSINGS)
BAG DECANTER FOR FLEXI CONT (MISCELLANEOUS) ×3 IMPLANT
BANDAGE GAUZE 4  KLING STR (GAUZE/BANDAGES/DRESSINGS) IMPLANT
BIT DRILL WIRE PASS 1.3MM (BIT) ×1 IMPLANT
BLADE CLIPPER SURG (BLADE) ×3 IMPLANT
BLADE SURG 11 STRL SS (BLADE) ×3 IMPLANT
BNDG COHESIVE 4X5 TAN NS LF (GAUZE/BANDAGES/DRESSINGS) IMPLANT
BUR ACORN 9.0 PRECISION (BURR) ×2 IMPLANT
BUR ACORN 9.0MM PRECISION (BURR) ×1
BUR SPIRAL ROUTER 2.3 (BUR) ×2 IMPLANT
BUR SPIRAL ROUTER 2.3MM (BUR) ×1
CANISTER SUCT 3000ML PPV (MISCELLANEOUS) ×6 IMPLANT
CARTRIDGE OIL MAESTRO DRILL (MISCELLANEOUS) ×1 IMPLANT
CLIP TI MEDIUM 6 (CLIP) IMPLANT
COVER BACK TABLE 60X90IN (DRAPES) ×2 IMPLANT
DECANTER SPIKE VIAL GLASS SM (MISCELLANEOUS) ×3 IMPLANT
DERMABOND ADVANCED (GAUZE/BANDAGES/DRESSINGS)
DERMABOND ADVANCED .7 DNX12 (GAUZE/BANDAGES/DRESSINGS) IMPLANT
DIFFUSER DRILL AIR PNEUMATIC (MISCELLANEOUS) ×3 IMPLANT
DRAIN CHANNEL 10M FLAT 3/4 FLT (DRAIN) ×3 IMPLANT
DRAPE NEUROLOGICAL W/INCISE (DRAPES) ×3 IMPLANT
DRAPE SURG 17X23 STRL (DRAPES) IMPLANT
DRAPE WARM FLUID 44X44 (DRAPE) ×3 IMPLANT
DRILL WIRE PASS 1.3MM (BIT) ×3
DRSG OPSITE 4X5.5 SM (GAUZE/BANDAGES/DRESSINGS) ×9 IMPLANT
ELECT CAUTERY BLADE 6.4 (BLADE) IMPLANT
ELECT REM PT RETURN 9FT ADLT (ELECTROSURGICAL) ×3
ELECTRODE REM PT RTRN 9FT ADLT (ELECTROSURGICAL) ×1 IMPLANT
EVACUATOR SILICONE 100CC (DRAIN) ×3 IMPLANT
GAUZE SPONGE 4X4 12PLY STRL (GAUZE/BANDAGES/DRESSINGS) ×3 IMPLANT
GAUZE SPONGE 4X4 16PLY XRAY LF (GAUZE/BANDAGES/DRESSINGS) IMPLANT
GLOVE BIO SURGEON STRL SZ8 (GLOVE) ×3 IMPLANT
GLOVE BIOGEL PI IND STRL 6.5 (GLOVE) ×1 IMPLANT
GLOVE BIOGEL PI IND STRL 7.0 (GLOVE) ×1 IMPLANT
GLOVE BIOGEL PI INDICATOR 6.5 (GLOVE) ×2
GLOVE BIOGEL PI INDICATOR 7.0 (GLOVE) ×2
GLOVE ECLIPSE 9.0 STRL (GLOVE) ×3 IMPLANT
GLOVE EXAM NITRILE LRG STRL (GLOVE) IMPLANT
GLOVE EXAM NITRILE XL STR (GLOVE) IMPLANT
GLOVE EXAM NITRILE XS STR PU (GLOVE) IMPLANT
GLOVE INDICATOR 8.5 STRL (GLOVE) ×3 IMPLANT
GLOVE SURG SS PI 6.5 STRL IVOR (GLOVE) ×6 IMPLANT
GOWN STRL REUS W/ TWL LRG LVL3 (GOWN DISPOSABLE) ×1 IMPLANT
GOWN STRL REUS W/ TWL XL LVL3 (GOWN DISPOSABLE) ×2 IMPLANT
GOWN STRL REUS W/TWL 2XL LVL3 (GOWN DISPOSABLE) IMPLANT
GOWN STRL REUS W/TWL LRG LVL3 (GOWN DISPOSABLE) ×3
GOWN STRL REUS W/TWL XL LVL3 (GOWN DISPOSABLE) ×6
GRAFT DURAGEN MATRIX 2WX2L ×3 IMPLANT
HEMOSTAT SURGICEL 2X14 (HEMOSTASIS) IMPLANT
KIT BASIN OR (CUSTOM PROCEDURE TRAY) ×3 IMPLANT
KIT ROOM TURNOVER OR (KITS) ×3 IMPLANT
NEEDLE HYPO 25X1 1.5 SAFETY (NEEDLE) ×3 IMPLANT
NS IRRIG 1000ML POUR BTL (IV SOLUTION) ×6 IMPLANT
OIL CARTRIDGE MAESTRO DRILL (MISCELLANEOUS) ×3
PACK CRANIOTOMY (CUSTOM PROCEDURE TRAY) ×3 IMPLANT
PAD ARMBOARD 7.5X6 YLW CONV (MISCELLANEOUS) ×6 IMPLANT
PATTIES SURGICAL .25X.25 (GAUZE/BANDAGES/DRESSINGS) IMPLANT
PATTIES SURGICAL .5 X.5 (GAUZE/BANDAGES/DRESSINGS) IMPLANT
PATTIES SURGICAL .5 X3 (DISPOSABLE) IMPLANT
PATTIES SURGICAL 1X1 (DISPOSABLE) IMPLANT
PIN MAYFIELD SKULL DISP (PIN) IMPLANT
PLATE 1.5 2H 17 DOU T (Plate) ×6 IMPLANT
PLATE 1.5/0.5 22MM BURR HO (Plate) ×3 IMPLANT
SCREW SELF DRILL HT 1.5/4MM (Screw) ×39 IMPLANT
SPONGE NEURO XRAY DETECT 1X3 (DISPOSABLE) IMPLANT
SPONGE SURGIFOAM ABS GEL 100 (HEMOSTASIS) ×6 IMPLANT
STAPLER SKIN PROX WIDE 3.9 (STAPLE) ×3 IMPLANT
STAPLER VISISTAT 35W (STAPLE) IMPLANT
SUT ETHILON 3 0 FSL (SUTURE) IMPLANT
SUT NURALON 4 0 TR CR/8 (SUTURE) ×6 IMPLANT
SUT VIC AB 2-0 CT1 18 (SUTURE) ×9 IMPLANT
SYR CONTROL 10ML LL (SYRINGE) ×3 IMPLANT
TOWEL OR 17X24 6PK STRL BLUE (TOWEL DISPOSABLE) ×3 IMPLANT
TOWEL OR 17X26 10 PK STRL BLUE (TOWEL DISPOSABLE) ×3 IMPLANT
TRAY FOLEY W/METER SILVER 16FR (SET/KITS/TRAYS/PACK) ×3 IMPLANT
UNDERPAD 30X30 (UNDERPADS AND DIAPERS) IMPLANT
WATER STERILE IRR 1000ML POUR (IV SOLUTION) ×3 IMPLANT

## 2016-03-23 NOTE — Anesthesia Postprocedure Evaluation (Signed)
Anesthesia Post Note  Patient: Timothy Murillo  Procedure(s) Performed: Procedure(s) (LRB): LEFT CRANIOTOMY FOR SUBDURAL HEMATOMA (Left)  Patient location during evaluation: PACU Anesthesia Type: General Level of consciousness: awake and alert Pain management: pain level controlled Vital Signs Assessment: post-procedure vital signs reviewed and stable Respiratory status: spontaneous breathing, nonlabored ventilation, respiratory function stable and patient connected to nasal cannula oxygen Cardiovascular status: blood pressure returned to baseline and stable Postop Assessment: no signs of nausea or vomiting Anesthetic complications: no    Last Vitals:  Vitals:   03/23/16 1739 03/23/16 1750  BP: (!) 123/105 124/86  Pulse: 74 77  Resp: 13 17  Temp: 36.2 C     Last Pain:  Vitals:   03/23/16 1800  TempSrc:   PainSc: Emmet D Alyda Megna

## 2016-03-23 NOTE — Anesthesia Preprocedure Evaluation (Addendum)
Anesthesia Evaluation  Patient identified by MRN, date of birth, ID band Patient awake    Reviewed: Allergy & Precautions, NPO status , Patient's Chart, lab work & pertinent test results  Airway Mallampati: II  TM Distance: >3 FB Neck ROM: Full    Dental  (+) Teeth Intact, Dental Advisory Given   Pulmonary asthma ,    Pulmonary exam normal breath sounds clear to auscultation       Cardiovascular Exercise Tolerance: Good negative cardio ROS Normal cardiovascular exam Rhythm:Regular Rate:Normal     Neuro/Psych Seizures - (keppra),  Brain tumor s/p resection Subdural hematoma negative psych ROS   GI/Hepatic negative GI ROS, Neg liver ROS,   Endo/Other  negative endocrine ROS  Renal/GU negative Renal ROS     Musculoskeletal negative musculoskeletal ROS (+)   Abdominal   Peds  Hematology negative hematology ROS (+)   Anesthesia Other Findings Day of surgery medications reviewed with the patient.  Reproductive/Obstetrics                            Anesthesia Physical Anesthesia Plan  ASA: III  Anesthesia Plan: General   Post-op Pain Management:    Induction: Intravenous  Airway Management Planned: Oral ETT  Additional Equipment: Arterial line  Intra-op Plan:   Post-operative Plan: Extubation in OR  Informed Consent: I have reviewed the patients History and Physical, chart, labs and discussed the procedure including the risks, benefits and alternatives for the proposed anesthesia with the patient or authorized representative who has indicated his/her understanding and acceptance.   Dental advisory given  Plan Discussed with: CRNA  Anesthesia Plan Comments: (Risks/benefits of general anesthesia discussed with patient including risk of damage to teeth, lips, gum, and tongue, nausea/vomiting, allergic reactions to medications, and the possibility of heart attack, stroke and death.   All patient questions answered.  Patient wishes to proceed.  2nd IV, arterial line after induction.)       Anesthesia Quick Evaluation

## 2016-03-23 NOTE — Transfer of Care (Signed)
Immediate Anesthesia Transfer of Care Note  Patient: Timothy Murillo  Procedure(s) Performed: Procedure(s): LEFT CRANIOTOMY FOR SUBDURAL HEMATOMA (Left)  Patient Location: PACU  Anesthesia Type:General  Level of Consciousness: awake, alert , oriented and patient cooperative  Airway & Oxygen Therapy: Patient Spontanous Breathing and Patient connected to nasal cannula oxygen  Post-op Assessment: Report given to RN and Post -op Vital signs reviewed and stable  Post vital signs: Reviewed and stable  Last Vitals:  Vitals:   03/23/16 1201  BP: (!) 142/87  Pulse: 71  Resp: 20  Temp: 36.8 C    Last Pain:  Vitals:   03/23/16 1201  TempSrc: Oral      Patients Stated Pain Goal: 4 ([id number])  Complications: No apparent anesthesia complications

## 2016-03-23 NOTE — Op Note (Signed)
Operative diagnosis: Left-sided chronic subdural hematoma postoperative diagnosis: Same  Procedure: Left frontoparietal craniotomy for evacuation of subacute to chronic subdural hematoma with complicating features stripping subdural membranes sending dural wedge biopsy and membranes for biopsy and sending fluid for cytology  Surgeon: Dominica Severin Lanise Mergen  Asst.: Mallie Mussel pool  Anesthesia: Gen.  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 So I recommended vagotomy for evacuation with fluid sent for cytology extensively went over the risks and benefits of the operation with the patient as well as perioperative course expectations of outcome alternatives to surgery and he understood and agreed to proceed forward.  Operative procedure: Patient brought into the or was induced under general anesthesia positioned supine with a shoulder bump under his left shoulder his head turned the right I drew out a horseshoe incision incorporating into his previous craniotomy incision and reflected the scalp and temporalis inferiorly towards the ear turned the cranial flap after 2 bur holes the dura was tense so I made a stab opening to the dura with 11 blade scalpel and aspirated 20 mL of subdural fluid through an Angiocath and put in a collection by December both cytology and microbiology. Then opened up the dura in a cruciate fashion there was a densely organized membranes stuck to the undersurface the dura that had a very proteinaceous look about it actually also had some grittiness to it and some septations. This several E samples of membrane were sent with the dural specimen for pathology. There was also a thin layer overlying  the peel surface which I opened up and get some more chronic fluid out of stripped is much membranes I thought it could safely the membrane was highly vascular. But maintain meticulous hemostasis and evacuate all hematoma looked around a 360 orientation both outside the membrane underneath the membrane to insure no additional hematoma was residing at removed all the membrane that was on the peel surface at the side craniotomy although there was some residua of the membrane around the craniotomy directed. Wounds copiously irrigated and I placed a Blake drain closed the dura over top with overlie overlie that with a piece of DuraGen reattach the bone flap and again placed a subdural Blake drain and closed the temporalis and closed the scalp with interrupted Vicryl and staples in the skin at the end of case all needle counts sponge counts were correct patient recovered in stable condition.

## 2016-03-23 NOTE — Anesthesia Procedure Notes (Signed)
Procedure Name: Intubation Date/Time: 03/23/2016 3:09 PM Performed by: Merrilyn Puma B Pre-anesthesia Checklist: Patient identified, Emergency Drugs available, Suction available, Patient being monitored and Timeout performed Patient Re-evaluated:Patient Re-evaluated prior to inductionOxygen Delivery Method: Circle system utilized Preoxygenation: Pre-oxygenation with 100% oxygen Intubation Type: IV induction Ventilation: Mask ventilation without difficulty Laryngoscope Size: Mac and 4 Grade View: Grade III Tube type: Oral Tube size: 8.0 mm Number of attempts: 1 Airway Equipment and Method: Stylet Placement Confirmation: ETT inserted through vocal cords under direct vision,  positive ETCO2,  CO2 detector and breath sounds checked- equal and bilateral Secured at: 24 cm Tube secured with: Tape Dental Injury: Teeth and Oropharynx as per pre-operative assessment

## 2016-03-23 NOTE — H&P (Signed)
Timothy Murillo is an 40 y.o. male.   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 Patient was doing very well had a routine follow-up MRI scan that showed a subdural fluid collection with minimal mass effect. Subdural fluid collection was approximately 2 cm mass effect with 7 mm of midline shift this was consistent with a hematoma patient had no known history of trauma. Patient is otherwise a sudden dramatic no headaches no dizziness or nausea or vomiting  Past Medical History:  Diagnosis Date  . Asthma    as a child  . Brain tumor ([place])   . History of kidney stones   . Seizures ([place])    last seizure 12/09/15.takes Keppra and Decadron daily    Past Surgical History:  Procedure Laterality Date  . ABDOMINAL SURGERY     hernia  . APPLICATION OF CRANIAL NAVIGATION N/A 12/19/2015   Procedure: APPLICATION OF CRANIAL NAVIGATION;  Surgeon: Kary Kos, MD;  Location: [facility];  Service: Neurosurgery;  Laterality: N/A;  . CRANIOTOMY Left 12/19/2015   Procedure: Left Parietal craniotomy for resection of tumor with brainlab;  Surgeon: Kary Kos, MD;  Location: [facility];  Service: Neurosurgery;  Laterality: Left;  Left Parietal craniotomy for resection of tumor with brainlab  . WISDOM TOOTH EXTRACTION      Family History  Problem Relation Age of Onset  . Hypertension Mother   . Hypertension Father   . Brain cancer Maternal Uncle    Social History:  reports that he has never smoked. He has never used smokeless tobacco. He reports that he drinks alcohol. He reports that he does not use drugs.  Allergies: No Known Allergies  Medications Prior to Admission  Medication Sig Dispense Refill  . levETIRAcetam (KEPPRA) 500 MG tablet Take 1 tablet (500 mg total) by mouth 2 (two) times daily. 60 tablet 0  . HYDROcodone-acetaminophen  (NORCO/VICODIN) 5-325 MG tablet Take 1 tablet by mouth every 4 (four) hours as needed for moderate pain. (Patient not taking: Reported on 03/20/2016) 60 tablet 0  . methylPREDNISolone (MEDROL DOSEPAK) 4 MG TBPK tablet follow package directions (Patient not taking: Reported on 03/20/2016) 21 tablet 0    Results for orders placed or performed during the hospital encounter of 03/23/16 (from the past 48 hour(s))  CBC     Status: Abnormal   Collection Time: 03/23/16 12:34 PM  Result Value Ref Range   WBC 5.7 4.0 - 10.5 K/uL   RBC 5.08 4.22 - 5.81 MIL/uL   Hemoglobin 14.2 13.0 - 17.0 g/dL   HCT 42.8 39.0 - 52.0 %   MCV 84.3 78.0 - 100.0 fL   MCH 28.0 26.0 - 34.0 pg   MCHC 33.2 30.0 - 36.0 g/dL   RDW 13.3 11.5 - 15.5 %   Platelets 439 (H) 150 - 400 K/uL  Basic metabolic panel     Status: None   Collection Time: 03/23/16 12:34 PM  Result Value Ref Range   Sodium 141 135 - 145 mmol/L   Potassium 3.8 3.5 - 5.1 mmol/L   Chloride 107 101 - 111 mmol/L   CO2 26 22 - 32 mmol/L   Glucose, Bld 86 65 - 99 mg/dL   BUN 10 6 - 20 mg/dL   Creatinine, Ser 0.89 0.61 - 1.24 mg/dL   Calcium 9.6 8.9 - 10.3 mg/dL   GFR calc  non Af Amer >60 >60 mL/min   GFR calc Af Amer >60 >60 mL/min    Comment: (NOTE) The eGFR has been calculated using the CKD EPI equation. This calculation has not been validated in all clinical situations. eGFR's persistently <60 mL/min signify possible Chronic Kidney Disease.    Anion gap 8 5 - 15  Type and screen All Cardiac and thoracic surgeries, spinal fusions, myomectomies, craniotomies, colon & liver resections, total joint revisions, same day c-section with placenta previa or accreta.     Status: None   Collection Time: 03/23/16 12:45 PM  Result Value Ref Range   ABO/RH(D) O POS    Antibody Screen NEG    Sample Expiration 03/26/2016    No results found.  Review of Systems  Neurological: Positive for headaches.  All other systems reviewed and are negative.   Blood  pressure (!) 142/87, pulse 71, temperature 98.2 F (36.8 C), temperature source Oral, resp. rate 20, height _0  (1.88 m), weight 90.3 kg (199 lb), SpO2 100 %. Physical Exam  Constitutional: He is oriented to person, place, and time. He appears well-developed and well-nourished.  HENT:  Head: Normocephalic.  Eyes: Pupils are equal, round, and reactive to light.  Neck: Normal range of motion.  Respiratory: Effort normal.  GI: Soft.  Neurological: He is alert and oriented to person, place, and time.  Patient is awake alert oriented 4 pupils equal extraocular movements are intact Timothy Murillo nerves are also otherwise intact. Strength 5 out of 5 no pronator drift.  Skin: Skin is warm and dry.     Assessment/Plan 40 year old with spontaneous subdural hematoma ipsilateral to the side of craniotomy for removal of a brain tumor. Patient is clinically isn't back over the subdural is large have recommended craniotomy for evacuation of subdural plan on sending the fluid off for cytology as well as cultures.  Jaydon Avina P, MD 03/23/2016, 2:35 PM

## 2016-03-24 ENCOUNTER — Inpatient Hospital Stay (HOSPITAL_COMMUNITY): Payer: PRIVATE HEALTH INSURANCE

## 2016-03-24 ENCOUNTER — Encounter (HOSPITAL_COMMUNITY): Payer: Self-pay | Admitting: Neurosurgery

## 2016-03-24 LAB — MRSA PCR SCREENING: MRSA by PCR: NEGATIVE

## 2016-03-24 MED ORDER — DIPHENHYDRAMINE HCL 50 MG/ML IJ SOLN
12.5000 mg | Freq: Four times a day (QID) | INTRAMUSCULAR | Status: DC | PRN
  Administered 2016-03-24 – 2016-03-25 (×3): 12.5 mg via INTRAVENOUS
  Filled 2016-03-24 (×3): qty 1

## 2016-03-24 MED ORDER — OXYCODONE-ACETAMINOPHEN 5-325 MG PO TABS
1.0000 | ORAL_TABLET | ORAL | Status: DC | PRN
  Administered 2016-03-24 – 2016-03-25 (×3): 1 via ORAL
  Filled 2016-03-24 (×3): qty 1

## 2016-03-24 NOTE — Progress Notes (Signed)
Pt c/o itching.  Pt also had some itching after receiving vicodin last night.  Notified Dr. Saintclair Halsted.  Prn order for benadryl and d/c vicodin.  Percocet ordered.  Pt's itching has resolved and Percocet tablet has been given resulting in relief of HA.  Will continue to monitor pt.

## 2016-03-24 NOTE — Progress Notes (Signed)
Patient ID: Timothy Murillo, male   DOB: Aug 02, 1975, 40 y.o.   MRN: [id number] Patient doing well no significant headache  Awake alert oriented pupils equal no pronator drift  Drain with 100 mL of output continue to observe  Watching the ICU continue subdural drainage CAT scan significant improvement and resolution of subdural fluid collection.

## 2016-03-25 MED ORDER — LEVETIRACETAM 500 MG PO TABS
500.0000 mg | ORAL_TABLET | Freq: Two times a day (BID) | ORAL | Status: DC
  Administered 2016-03-25 – 2016-03-26 (×3): 500 mg via ORAL
  Filled 2016-03-25 (×3): qty 1

## 2016-03-25 MED ORDER — PANTOPRAZOLE SODIUM 40 MG PO TBEC
40.0000 mg | DELAYED_RELEASE_TABLET | Freq: Every day | ORAL | Status: DC
  Administered 2016-03-25: 40 mg via ORAL
  Filled 2016-03-25: qty 1

## 2016-03-25 NOTE — Progress Notes (Signed)
Patient ID: TUCK TIN, male   DOB: 13-Nov-1975, 40 y.o.   MRN: [id number] Doing very well minimal headache  Neurally intact bandage clean dry and intact  DC'd subdural drain transferred to the floor PT eval and treat

## 2016-03-25 NOTE — Progress Notes (Signed)
Patient arrived to unit by wheelchair accompanied by staff and family.  Alert, oriented with no complaints of pain or discomfort.  Dressing to scalp intact with small amount of serosanguinous drainage.  Will continue to monitor.

## 2016-03-25 NOTE — Progress Notes (Signed)
Visited w/ pt in rm. Provided spiritual/emotional support and prayer. Pt really appreciated prayer.   03/25/16 1500  Clinical Encounter Type  Visited With Patient  Visit Type Initial  Referral From [place] Needs Prayer;Emotional  Stress Factors  Patient Stress Factors Health changes  Gerrit Heck, Chaplain

## 2016-03-25 NOTE — Progress Notes (Signed)
Pt walked 1 lap around unit with no issues and standby assist.

## 2016-03-26 MED ORDER — HYDROCODONE-ACETAMINOPHEN 5-325 MG PO TABS: 1.0000 | ORAL_TABLET | Freq: Four times a day (QID) | ORAL | 0 refills | Status: AC | PRN

## 2016-03-26 NOTE — Discharge Instructions (Signed)
Craniotomy, Care After These instructions give you information on caring for yourself after your procedure. Your doctor may also give you more specific instructions. Call your doctor if you have any problems or questions after your procedure. Follow these instructions at home:  Sleep and rest. When lying down, keep your head raised by using pillows. This is important after surgery. Ask your doctor when you can go back to sleeping flat.  Change bandages (dressings) as told by your doctor. Someone may need to change them for you.  Keep your wound clean and dry. Wash the wound gently with soap and water. Blot or pat the wound dry. Do not rub it.  Wear a helmet as told by your doctor.  Only take medicine as told by your doctor. Do not take other medicines without asking your doctor first.  Do not drink alcohol.  Take showers if your doctor says it is okay. Cover the wounds before the shower as told by your doctor. Do not take baths, swim, or use a hot tub for 10 days or as told by your doctor.  Keep eating what you normally do or as told by your doctor.  Do not drive until your doctor says it is okay.  Limit activity or movements as told by your doctor. Take short walks if your doctor says it is okay. Gradually become more active. Wait at least 3 months before you return to mild sports that do not involve contact with other players.  Ask your doctor when you can go back to work.  Follow up with your doctor as told. Stitches or staples are often removed about 1 week after surgery. Contact a doctor if:  You have redness, puffiness (swelling), or more pain in your wound or where the pins were placed.  You have fluid or pus come from your wound. Get help right away if:  You feel sick to your stomach (nauseous), or you throw up (vomit).  You feel confused.  You have very bad headaches.  You have uncontrolled shaking or jerking movements (seizures).  You have chest pain, a stiff neck,  or trouble breathing.  You have more puffiness or bruising around the eyes.  You have a fever.  You notice a bad smell coming from the wound or bandage.  Your wound breaks open after the stitches or staples have been removed.  You feel dizzy or pass out (faint) while standing.  You have a rash. This information is not intended to replace advice given to you by your health care provider. Make sure you discuss any questions you have with your health care provider. Document Released: 04/25/2013 Document Revised: 09/26/2015 Document Reviewed: 11/30/2012 Elsevier Interactive Patient Education  2017 [employer].

## 2016-03-26 NOTE — Evaluation (Signed)
Physical Therapy Evaluation Patient Details Name: Timothy Murillo MRN: [id number] DOB: 08-May-1975 Today's Date: 03/26/2016   History of Present Illness  Patient is a 40 yo male admitted 03/23/16 now s/p craniotomy and evacuation of SDH.   PMH:  craniotomy with tumor excision 3 months pta, asthma, seizures  Clinical Impression  Patient is functioning independently with all mobility and gait.  Good balance, scoring 24/24 on DGI balance assessment.  No PT needs identified - will sign off.    Follow Up Recommendations No PT follow up    Equipment Recommendations  None recommended by PT    Recommendations for Other Services       Precautions / Restrictions Precautions Precautions: None Restrictions Weight Bearing Restrictions: No      Mobility  Bed Mobility Overal bed mobility: Independent                Transfers Overall transfer level: Independent Equipment used: None                Ambulation/Gait Ambulation/Gait assistance: Independent Ambulation Distance (Feet): 200 Feet Assistive device: None Gait Pattern/deviations: WFL(Within Functional Limits)   Gait velocity interpretation: at or above normal speed for age/gender General Gait Details: Patient with good gait pattern, speed, and balance  Stairs Stairs: Yes Stairs assistance: Independent Stair Management: No rails;Alternating pattern;Forwards Number of Stairs: 4 General stair comments: Able to negotiate stairs with no rail for balance assessment  Wheelchair Mobility    Modified Rankin (Stroke Patients Only)       Balance Overall balance assessment: Independent                               Standardized Balance Assessment Standardized Balance Assessment : Dynamic Gait Index   Dynamic Gait Index Level Surface: Normal Change in Gait Speed: Normal Gait with Horizontal Head Turns: Normal Gait with Vertical Head Turns: Normal Gait and Pivot Turn: Normal Step Over Obstacle:  Normal Step Around Obstacles: Normal Steps: Normal Total Score: 24       Pertinent Vitals/Pain Pain Assessment: 0-10 Pain Score: 1  Pain Location: head Pain Descriptors / Indicators: Sore Pain Intervention(s): Monitored during session    Home Living Family/patient expects to be discharged to:: Private residence Living Arrangements: Spouse/significant other;Children Available Help at Discharge: Family;Available 24 hours/day Type of Home: House Home Access: Level entry     Home Layout: One level Home Equipment: None      Prior Function Level of Independence: Independent               Hand Dominance        Extremity/Trunk Assessment   Upper Extremity Assessment: Overall WFL for tasks assessed           Lower Extremity Assessment: Overall WFL for tasks assessed         Communication   Communication: No difficulties  Cognition Arousal/Alertness: Awake/alert Behavior During Therapy: WFL for tasks assessed/performed Overall Cognitive Status: Within Functional Limits for tasks assessed                      General Comments      Exercises     Assessment/Plan    PT Assessment Patent does not need any further PT services  PT Problem List            PT Treatment Interventions      PT Goals (Current goals can be found in the  Care Plan section)  Acute Rehab PT Goals PT Goal Formulation: All assessment and education complete, DC therapy    Frequency     Barriers to discharge        Co-evaluation               End of Session   Activity Tolerance: Patient tolerated treatment well Patient left: in bed;with call bell/phone within reach (sitting EOB)           Time: [id number] PT Time Calculation (min) (ACUTE ONLY): 10 min   Charges:   PT Evaluation $PT Eval Low Complexity: 1 Procedure     PT G CodesDespina Pole 2016-04-23, 8:42 AM Carita Pian. Sanjuana Kava, [place] Pager [phone]

## 2016-03-26 NOTE — Progress Notes (Signed)
Patient given discharge paperwork this morning.  All questions and concerns addressed. Alert, pleasant,verbalized understanding. Bandages, dressing to scalp remain clean, dry, intact without increased drainage. No noted swelling or redness.  No complaints of pain or discomfort.  IV catheters removed without difficulty.

## 2016-03-26 NOTE — Discharge Summary (Signed)
Physician Discharge Summary  Patient ID: Timothy Murillo MRN: [id number] DOB/AGE: 09/05/1975 40 y.o.  Admit date: 03/23/2016 Discharge date: 03/26/2016  Admission Diagnoses: Subdural hematoma    Discharge Diagnoses: Same   Discharged Condition: good  Hospital Course: The patient was admitted on 03/23/2016 and taken to the operating room where the patient underwent craniotomy for subdural hematoma. The patient tolerated the procedure well and was taken to the recovery room and then to the ICU in stable condition. The hospital course was routine. There were no complications. The wound remained clean dry and intact. Pt had appropriate incisional soreness. No complaints of  new N/T/W. The patient remained afebrile with stable vital signs, and tolerated a regular diet. The patient continued to increase activities, and pain was well controlled with oral pain medications.   Consults: None  Significant Diagnostic Studies:  Results for orders placed or performed during the hospital encounter of 03/23/16  Aerobic/Anaerobic Culture (surgical/deep wound)  Result Value Ref Range   Specimen Description WOUND    Special Requests SUBDURAL FLUID POF ANCEF    Gram Stain      RARE WBC PRESENT, PREDOMINANTLY MONONUCLEAR NO ORGANISMS SEEN Gram Stain Report Called to,Read Back By and Verified With: J. KEY, OR RN AT [id number] ON 03/23/16 BY C. JESSUP, MLT.    Culture NO GROWTH 2 DAYS    Report Status PENDING   MRSA PCR Screening  Result Value Ref Range   MRSA by PCR NEGATIVE NEGATIVE  CBC  Result Value Ref Range   WBC 5.7 4.0 - 10.5 K/uL   RBC 5.08 4.22 - 5.81 MIL/uL   Hemoglobin 14.2 13.0 - 17.0 g/dL   HCT 42.8 39.0 - 52.0 %   MCV 84.3 78.0 - 100.0 fL   MCH 28.0 26.0 - 34.0 pg   MCHC 33.2 30.0 - 36.0 g/dL   RDW 13.3 11.5 - 15.5 %   Platelets 439 (H) 150 - 400 K/uL  Basic metabolic panel  Result Value Ref Range   Sodium 141 135 - 145 mmol/L   Potassium 3.8 3.5 - 5.1 mmol/L   Chloride 107 101 -  111 mmol/L   CO2 26 22 - 32 mmol/L   Glucose, Bld 86 65 - 99 mg/dL   BUN 10 6 - 20 mg/dL   Creatinine, Ser 0.89 0.61 - 1.24 mg/dL   Calcium 9.6 8.9 - 10.3 mg/dL   GFR calc non Af Amer >60 >60 mL/min   GFR calc Af Amer >60 >60 mL/min   Anion gap 8 5 - 15  Basic metabolic panel  Result Value Ref Range   Sodium 140 135 - 145 mmol/L   Potassium 4.0 3.5 - 5.1 mmol/L   Chloride 108 101 - 111 mmol/L   CO2 23 22 - 32 mmol/L   Glucose, Bld 155 (H) 65 - 99 mg/dL   BUN 10 6 - 20 mg/dL   Creatinine, Ser 0.97 0.61 - 1.24 mg/dL   Calcium 8.7 (L) 8.9 - 10.3 mg/dL   GFR calc non Af Amer >60 >60 mL/min   GFR calc Af Amer >60 >60 mL/min   Anion gap 9 5 - 15  CBC  Result Value Ref Range   WBC 10.1 4.0 - 10.5 K/uL   RBC 4.97 4.22 - 5.81 MIL/uL   Hemoglobin 13.7 13.0 - 17.0 g/dL   HCT 42.5 39.0 - 52.0 %   MCV 85.5 78.0 - 100.0 fL   MCH 27.6 26.0 - 34.0 pg   MCHC 32.2 30.0 -  36.0 g/dL   RDW 13.4 11.5 - 15.5 %   Platelets 414 (H) 150 - 400 K/uL  Type and screen All Cardiac and thoracic surgeries, spinal fusions, myomectomies, craniotomies, colon & liver resections, total joint revisions, same day c-section with placenta previa or accreta.  Result Value Ref Range   ABO/RH(D) O POS    Antibody Screen NEG    Sample Expiration 03/26/2016     Ct Head Wo Contrast  Result Date: 03/24/2016 CLINICAL DATA:  Subdural hematoma EXAM: CT HEAD WITHOUT CONTRAST TECHNIQUE: Contiguous axial images were obtained from the base of the skull through the vertex without intravenous contrast. COMPARISON:  Brain MRI 03/16/2016, head CT 12/10/2015 FINDINGS: Brain: Low-density subdural hematoma overlying the left hemisphere measures approximately 1 cm in thickness, decreased from 2.2 cm on the prior MRI. There is a subdural drainage catheter in place. Small amount of associated pneumocephalus along the left convexity. Mass effect on the left lateral ventricle is decreased. There is approximately 2 mm of rightward midline  shift at the level of the foramina of [place], decreased from 4 mm previously. There is no new hemorrhage. No hydrocephalus. No CT evidence of acute cortical infarct. Vascular: No hyperdense vessel or unexpected calcification. Skull: Post craniotomy findings of the left hemispheric calvarium Sinuses/Orbits: The visualized portions of the paranasal sinuses and mastoid air cells are free of fluid. No advanced mucosal thickening. The visualized orbits are normal. Other: None IMPRESSION: 1. Status post placement of left convexity subdural drainage catheter. 2. Decreased size and density of left convexity subdural hematoma with associated decrease in midline shift now measuring 2 mm left to right. 3. No new hemorrhage or other acute finding. Electronically Signed   By: Ulyses Jarred M.D.   On: 03/24/2016 06:28    Antibiotics:  Anti-infectives    Start     Dose/Rate Route Frequency Ordered Stop   03/23/16 2300  ceFAZolin (ANCEF) IVPB 2g/100 mL premix     2 g 200 mL/hr over 30 Minutes Intravenous Every 8 hours 03/23/16 2048 03/25/16 1704   03/23/16 1640  bacitracin 50,000 Units in sodium chloride irrigation 0.9 % 500 mL irrigation  Status:  Discontinued       As needed 03/23/16 1640 03/23/16 1733   03/23/16 0600  ceFAZolin (ANCEF) IVPB 2g/100 mL premix     2 g 200 mL/hr over 30 Minutes Intravenous On call to O.R. 03/22/16 1413 03/23/16 1525      Discharge Exam: Blood pressure 129/85, pulse 80, temperature 97.9 F (36.6 C), temperature source Oral, resp. rate 20, height 6\' 2"  (1.88 m), weight 90.3 kg (199 lb), SpO2 99 %. Neurologic: Grossly normal Dressing dry  Discharge Medications:     Medication List    STOP taking these medications   methylPREDNISolone 4 MG Tbpk tablet Commonly known as:  MEDROL DOSEPAK     TAKE these medications   HYDROcodone-acetaminophen 5-325 MG tablet Commonly known as:  NORCO/VICODIN Take 1 tablet by mouth every 6 (six) hours as needed for moderate pain. What  changed:  when to take this   levETIRAcetam 500 MG tablet Commonly known as:  KEPPRA Take 1 tablet (500 mg total) by mouth 2 (two) times daily.       Disposition: Home   Final Dx: Left craniotomy for subdural hematoma  Discharge Instructions    Call MD for:  difficulty breathing, headache or visual disturbances    Complete by:  As directed    Call MD for:  persistant nausea and vomiting  Complete by:  As directed    Call MD for:  redness, tenderness, or signs of infection (pain, swelling, redness, odor or green/yellow discharge around incision site)    Complete by:  As directed    Call MD for:  severe uncontrolled pain    Complete by:  As directed    Call MD for:  temperature >100.4    Complete by:  As directed    Diet - low sodium heart healthy    Complete by:  As directed    Discharge instructions    Complete by:  As directed    No driving, may shower, no heavy lifting, no strenuous activity   Increase activity slowly    Complete by:  As directed    Remove dressing in 48 hours    Complete by:  As directed       Follow-up Information    CRAM,GARY P, MD. Schedule an appointment as soon as possible for a visit in 2 week(s).   Specialty:  Neurosurgery Contact information: [address] [place] [address] [phone]        Elaina Hoops, MD .   Specialty:  Neurosurgery Contact information: [address] [place] [address] [phone]            Signed: Eustace Moore 03/26/2016, 5:20 AM

## 2016-03-28 LAB — AEROBIC/ANAEROBIC CULTURE W GRAM STAIN (SURGICAL/DEEP WOUND)

## 2016-03-28 LAB — AEROBIC/ANAEROBIC CULTURE (SURGICAL/DEEP WOUND): CULTURE: NO GROWTH

## 2016-03-30 ENCOUNTER — Encounter (HOSPITAL_COMMUNITY): Payer: Self-pay | Admitting: Neurosurgery

## 2017-03-01 IMAGING — CT CT HEAD W/O CM
3 of 4 series · 17 of 47 positions shown, 20 images · non-contrast
Comparison: Brain MRI 03/16/2016, head CT 12/10/2015

CLINICAL DATA: Subdural hematoma

EXAM:
CT HEAD WITHOUT CONTRAST
TECHNIQUE: Contiguous axial images were obtained from the base of the skull
through the vertex without intravenous contrast.

[Series 201: head w/o, idose (1) · axial · non-contrast · 0.43mm/px · z∈[+106,+241]mm · 11 of 33 slices shown, 14 images]
[im 3/33  brain]
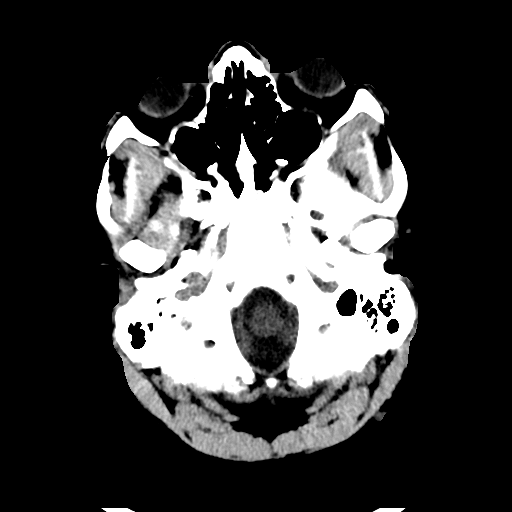
[im 3/33  bone]
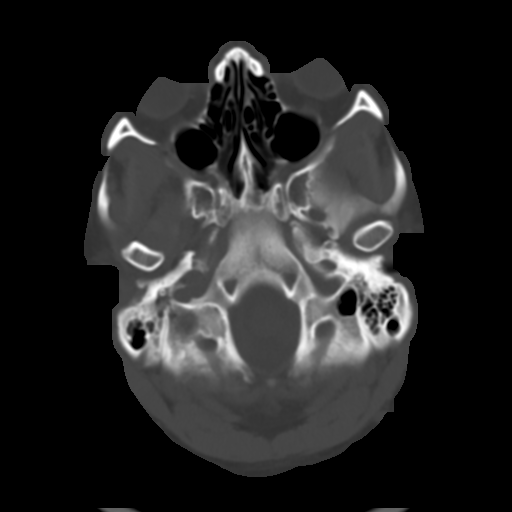
[im 5/33  brain]
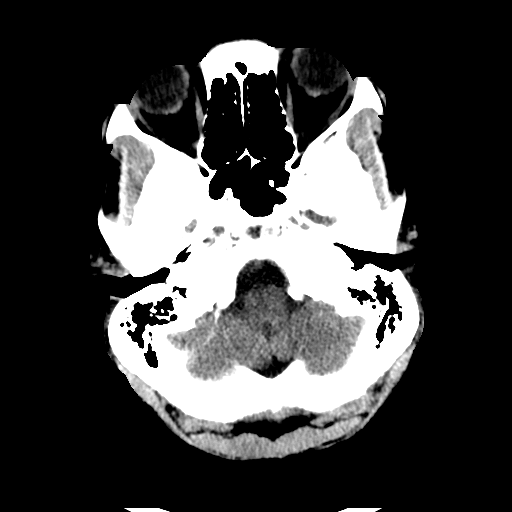
[im 7/33  brain]
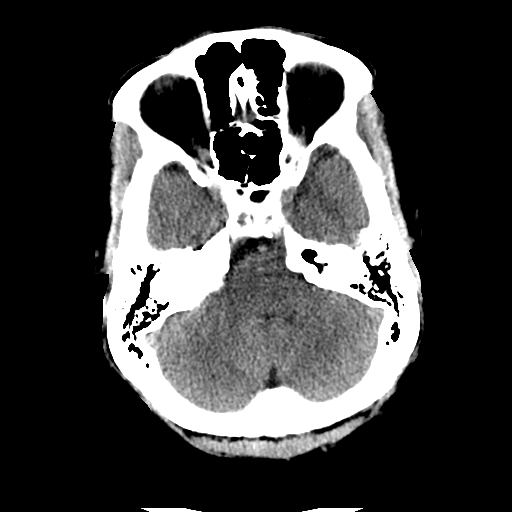
[im 12/33  brain]
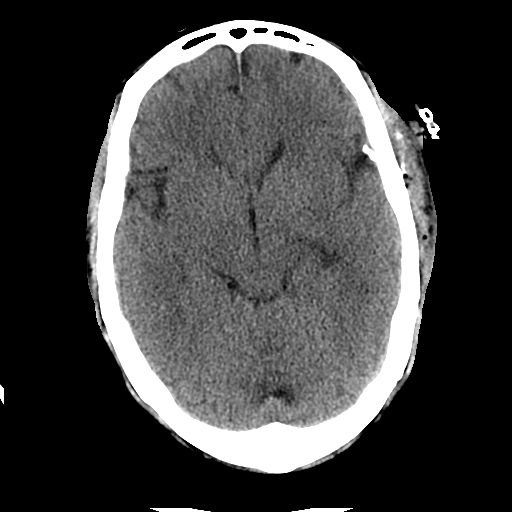
[im 14/33  brain]
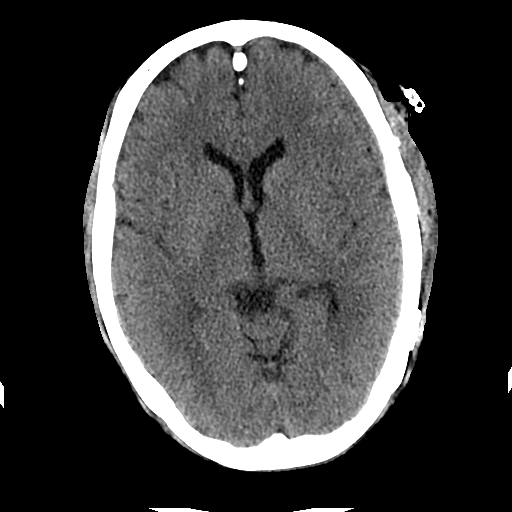
[im 14/33  bone]
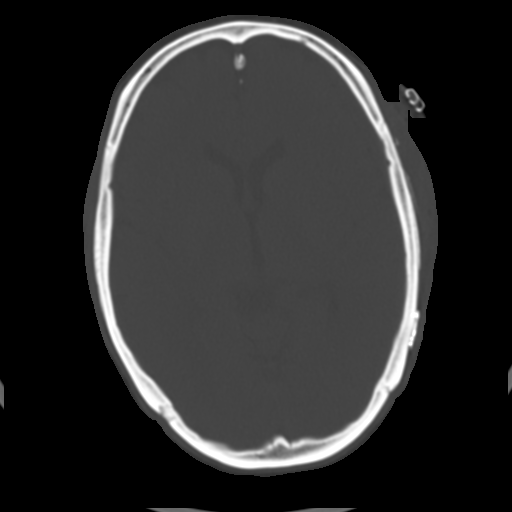
[im 17/33  brain]
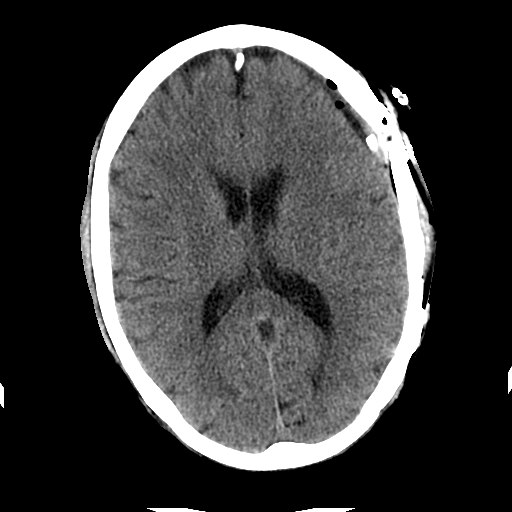
[im 19/33  brain]
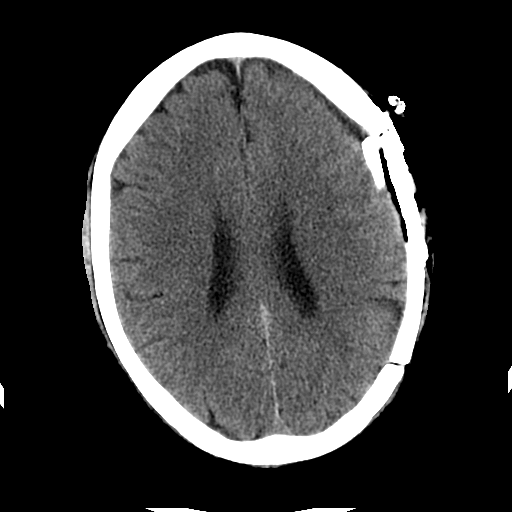
[im 21/33  brain]
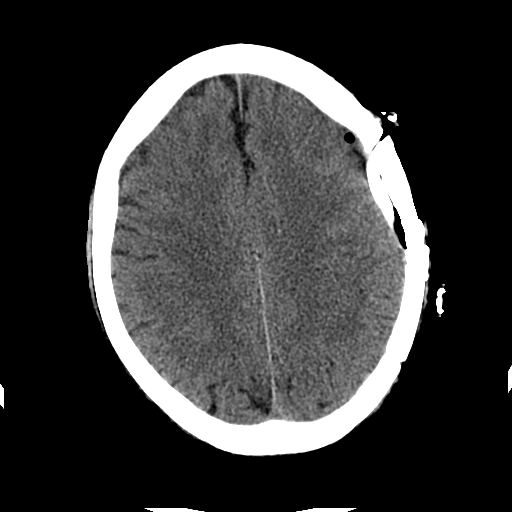
[im 26/33  brain]
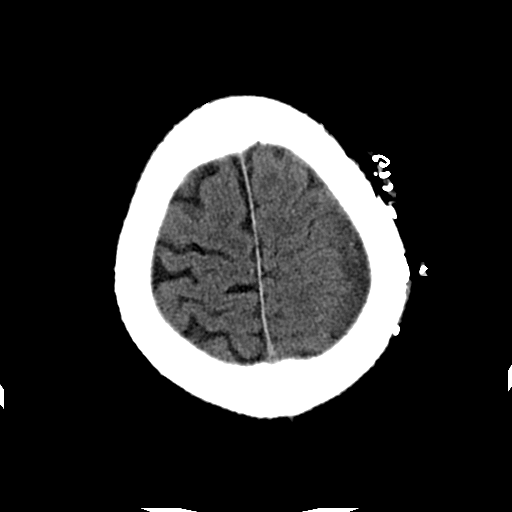
[im 26/33  bone]
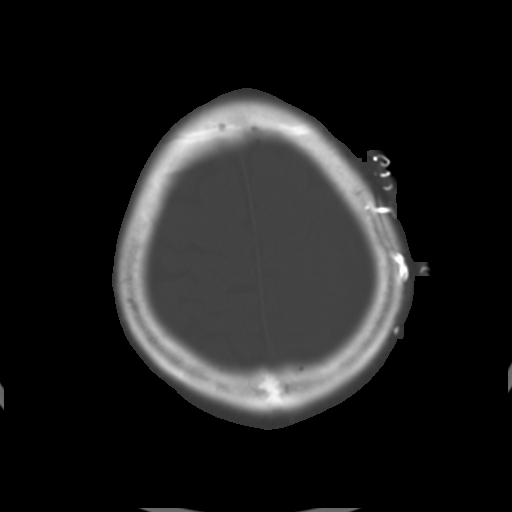
[im 28/33  brain]
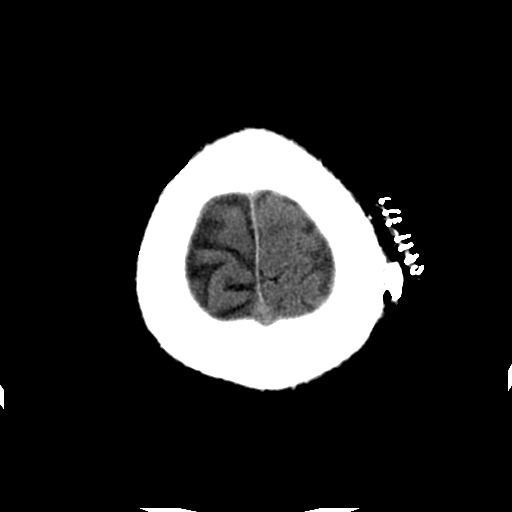
[im 30/33  brain]
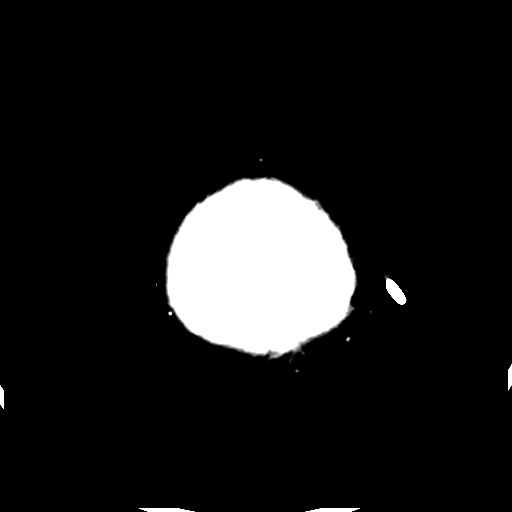

[Series 203: coronal st, idose (1) · coronal · 0.40mm/px · 3 of 73 slices shown]
[im 25/73  brain]
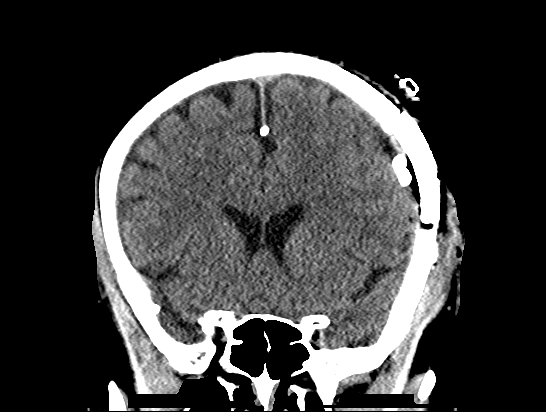
[im 33/73  brain]
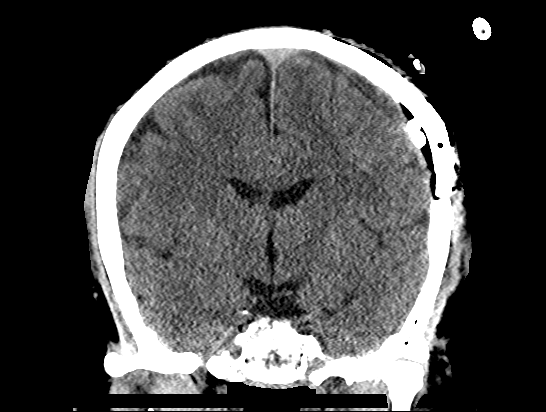
[im 41/73  brain]
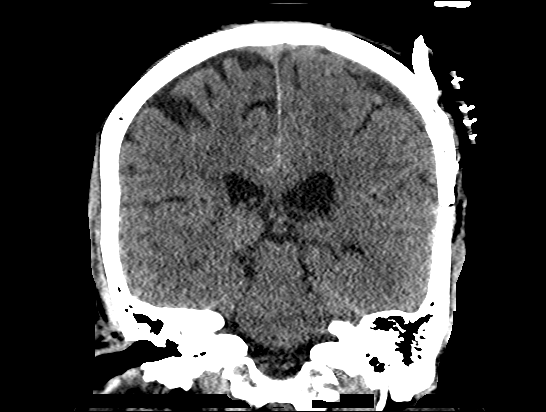

[Series 204: sagittal st, idose (1) · sagittal · 0.40mm/px · 3 of 73 slices shown]
[im 25/73  brain]
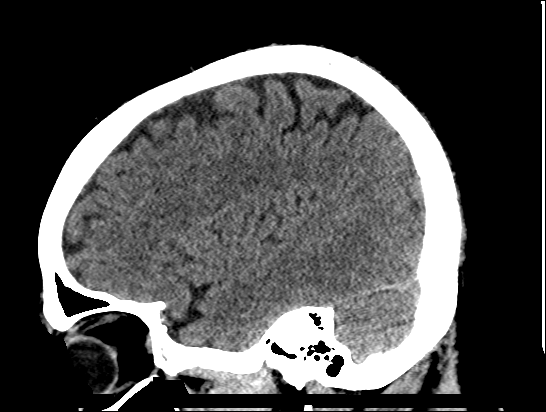
[im 37/73  brain]
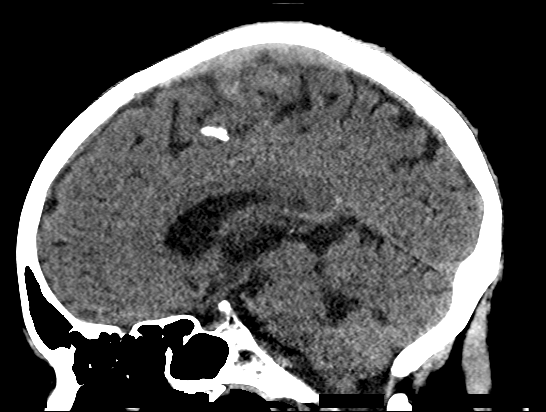
[im 49/73  brain]
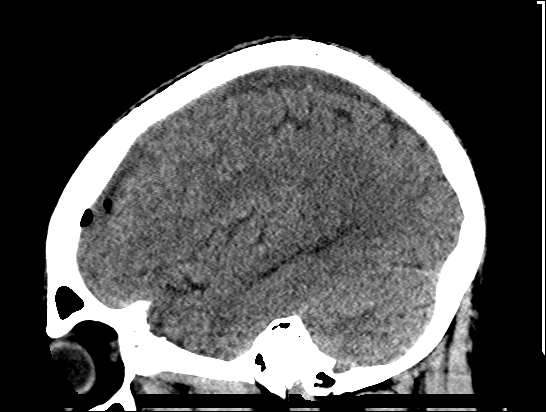

[17 of 47 positions shown; findings below may reference images not displayed]

FINDINGS: Brain: Low-density subdural hematoma overlying the left hemisphere
measures approximately 1 cm in thickness, decreased from 2.2 cm on
the prior MRI. There is a subdural drainage catheter in place. Small
amount of associated pneumocephalus along the left convexity. Mass
effect on the left lateral ventricle is decreased. There is
approximately 2 mm of rightward midline shift at the level of the
foramina of [place], decreased from 4 mm previously.

There is no new hemorrhage. No hydrocephalus. No CT evidence of
acute cortical infarct.

Vascular: No hyperdense vessel or unexpected calcification.

Skull: Post craniotomy findings of the left hemispheric calvarium

Sinuses/Orbits: The visualized portions of the paranasal sinuses and
mastoid air cells are free of fluid. No advanced mucosal thickening.
The visualized orbits are normal.

Other: None
IMPRESSION: 1. Status post placement of left convexity subdural drainage
catheter.
2. Decreased size and density of left convexity subdural hematoma
with associated decrease in midline shift now measuring 2 mm left to
right.
3. No new hemorrhage or other acute finding.
